# Patient Record
Sex: Female | Born: 1980 | Race: White | Hispanic: No | Marital: Married | Smoking: Never smoker
Health system: Southern US, Community
[De-identification: ages and names within clinical notes are randomized; demographics above are authoritative.]

## PROBLEM LIST (undated history)

## (undated) DIAGNOSIS — J310 Chronic rhinitis: Secondary | ICD-10-CM

## (undated) DIAGNOSIS — R51 Headache: Secondary | ICD-10-CM

## (undated) DIAGNOSIS — K589 Irritable bowel syndrome without diarrhea: Secondary | ICD-10-CM

## (undated) DIAGNOSIS — Z8619 Personal history of other infectious and parasitic diseases: Secondary | ICD-10-CM

## (undated) DIAGNOSIS — Z9889 Other specified postprocedural states: Secondary | ICD-10-CM

## (undated) DIAGNOSIS — R519 Headache, unspecified: Secondary | ICD-10-CM

## (undated) DIAGNOSIS — R112 Nausea with vomiting, unspecified: Secondary | ICD-10-CM

## (undated) HISTORY — DX: Personal history of other infectious and parasitic diseases: Z86.19

## (undated) HISTORY — PX: WISDOM TOOTH EXTRACTION: SHX21

---

## 2009-07-07 ENCOUNTER — Encounter: Admission: RE | Admit: 2009-07-07 | Discharge: 2009-07-07 | Payer: Self-pay | Admitting: Allergy

## 2011-06-25 IMAGING — CT CT MAXILLOFACIAL W/O CM
3 series · 17 of 47 positions shown, 20 images · non-contrast
Comparison: None.

CLINICAL DATA: Sinus infections over the last 3 years

CT MAXILLOFACIAL WITHOUT CONTRAST
TECHNIQUE: Multidetector CT imaging of the maxillofacial
structures was performed. Multiplanar CT image reconstructions were
also generated.

[Series 4: max soft · axial · 0.36mm/px · z∈[+102,+239]mm · 11 of 65 slices shown, 14 images]
[im 5/65  brain]
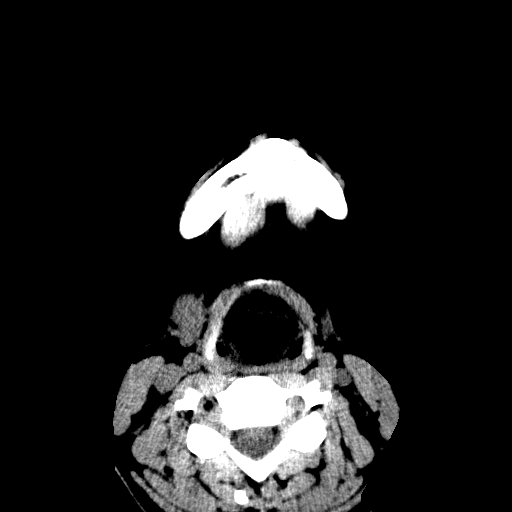
[im 5/65  bone]
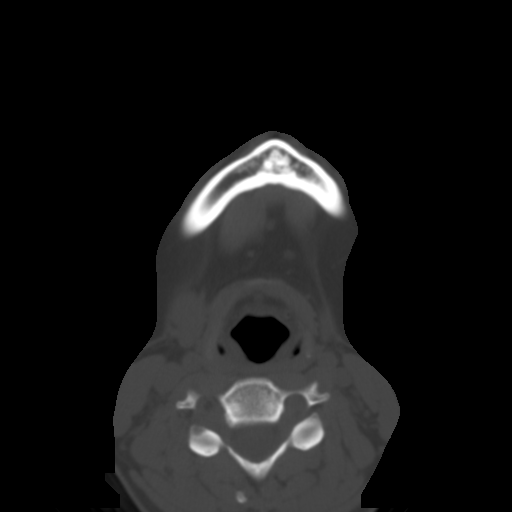
[im 9/65  bone]
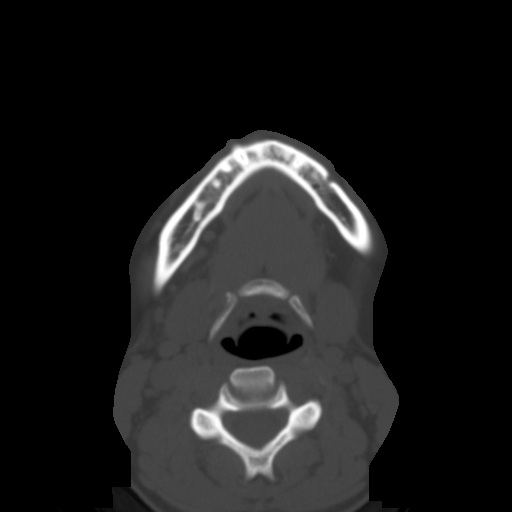
[im 16/65  bone]
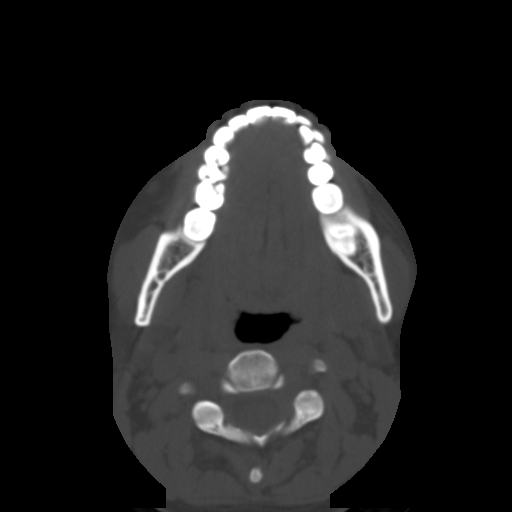
[im 20/65  bone]
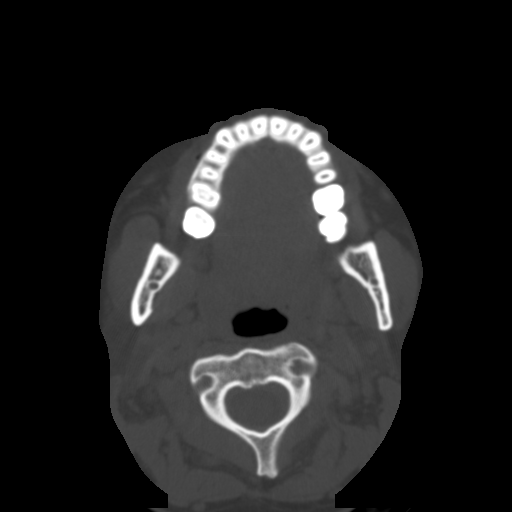
[im 27/65  brain]
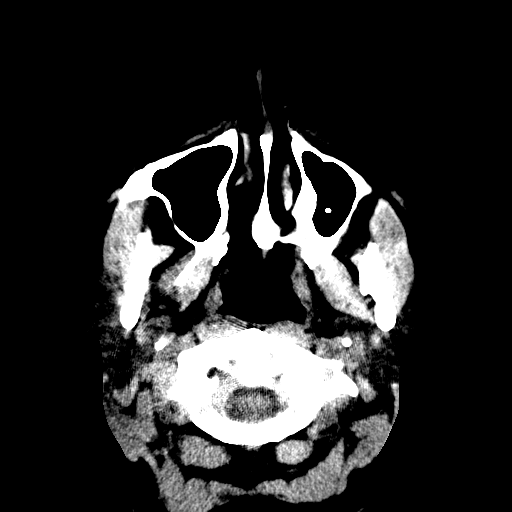
[im 27/65  bone]
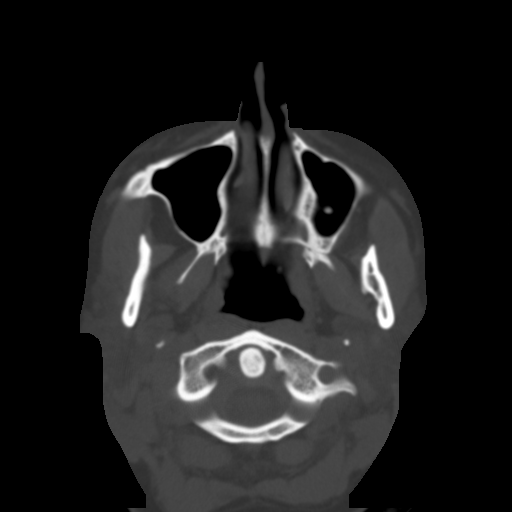
[im 34/65  bone]
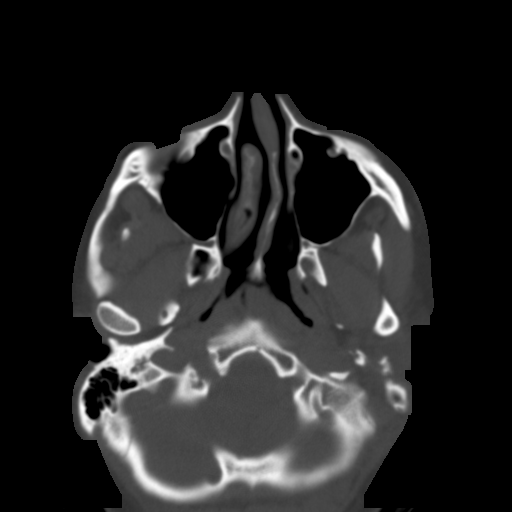
[im 38/65  bone]
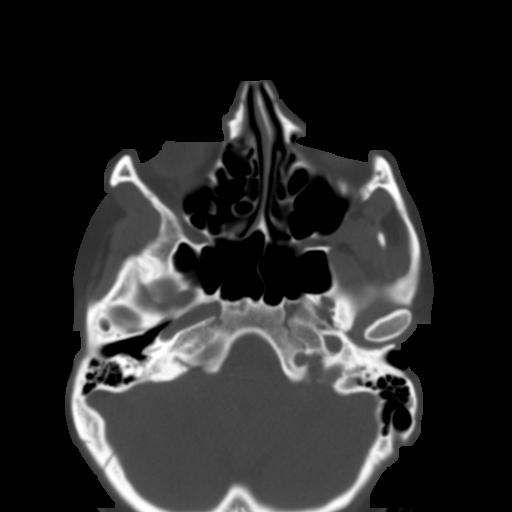
[im 45/65  bone]
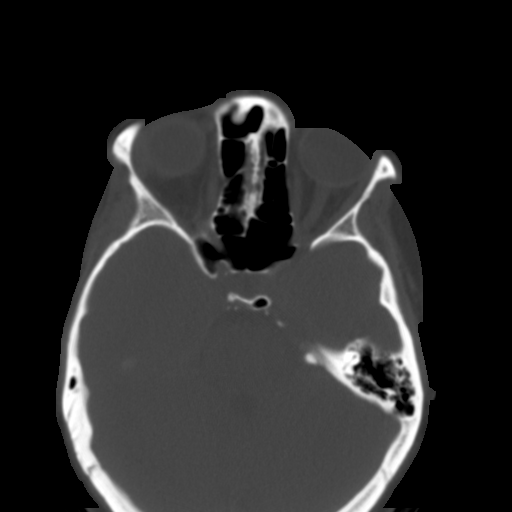
[im 49/65  brain]
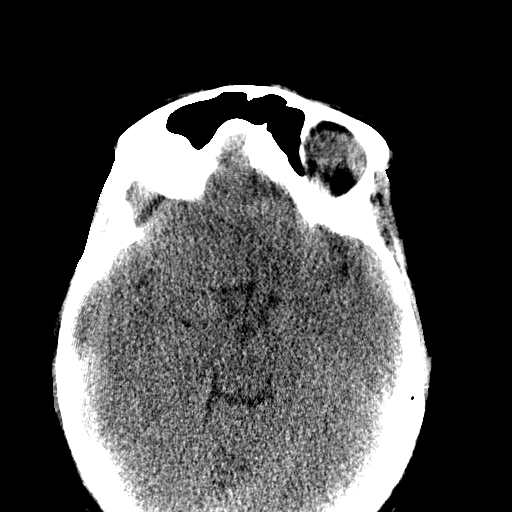
[im 49/65  bone]
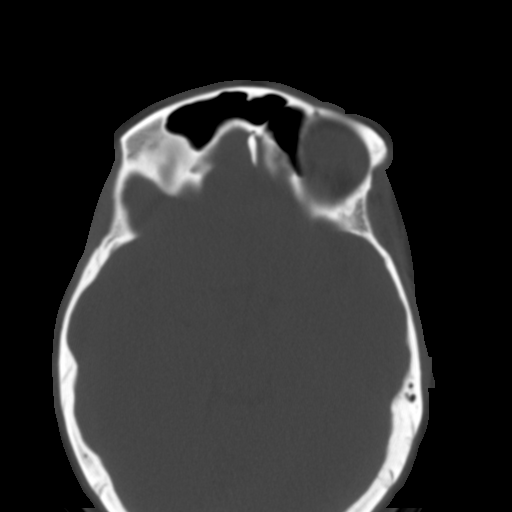
[im 56/65  bone]
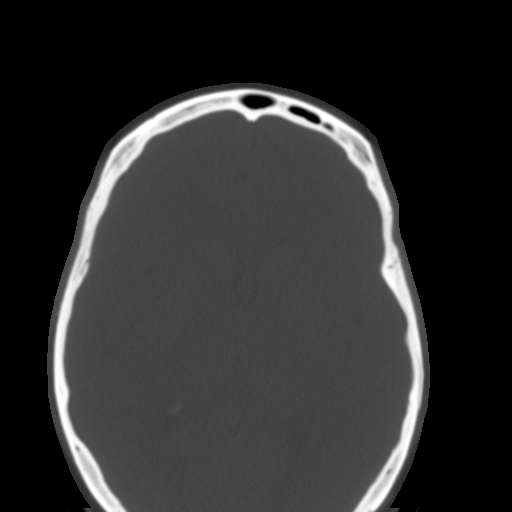
[im 60/65  bone]
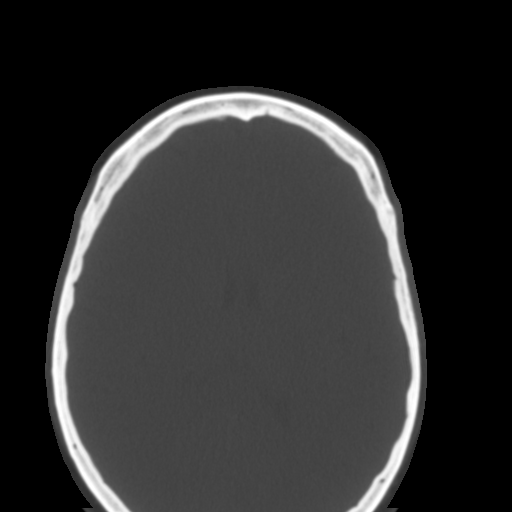

[st cor · coronal · 0.36mm/px · 3 of 41 slices shown]
[im 14/41  bone]
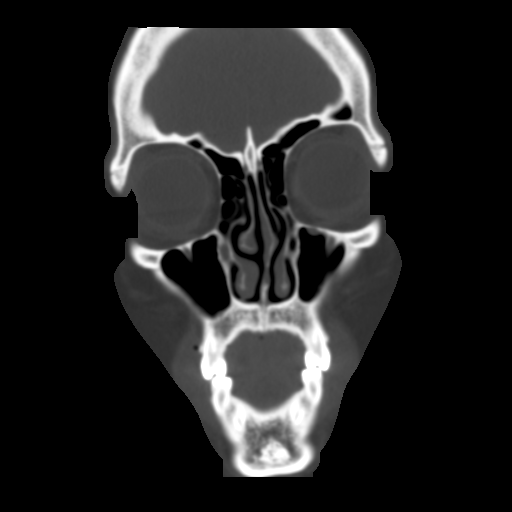
[im 18/41  bone]
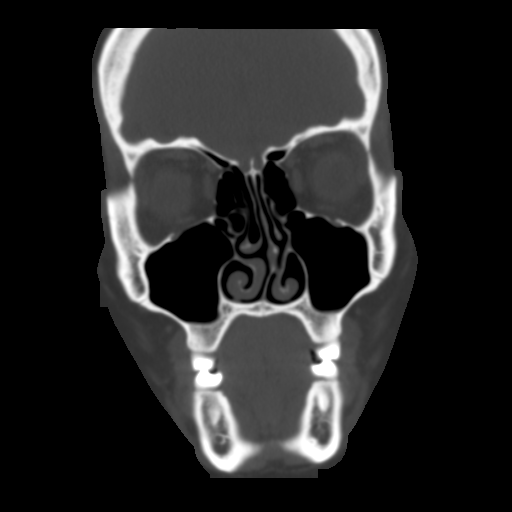
[im 23/41  bone]
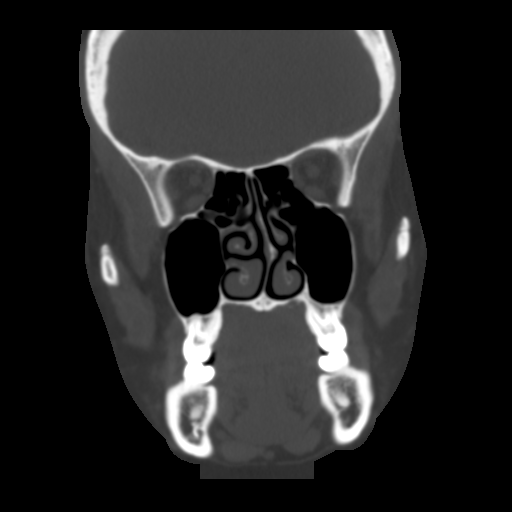

[st sag · sagittal · 0.36mm/px · 3 of 48 slices shown]
[im 16/48  bone]
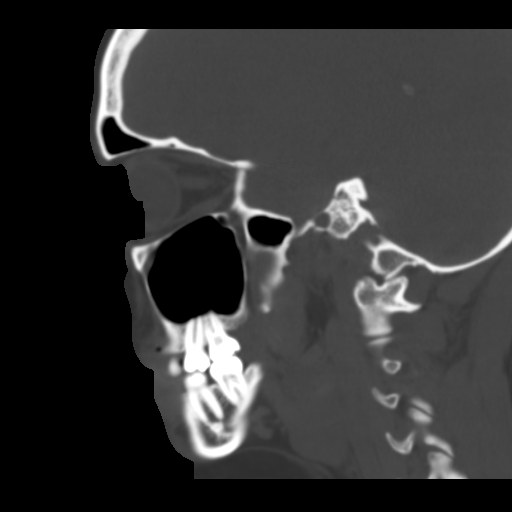
[im 24/48  bone]
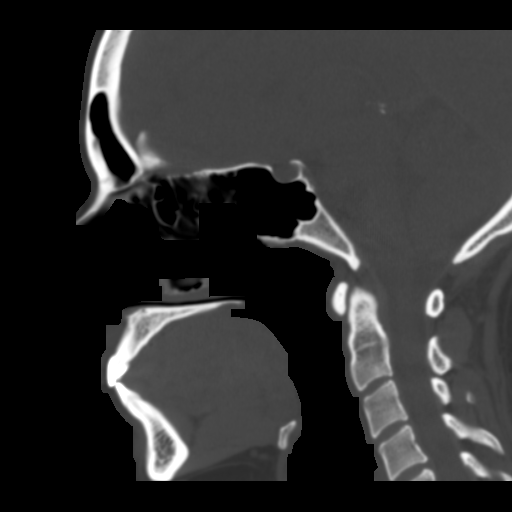
[im 32/48  bone]
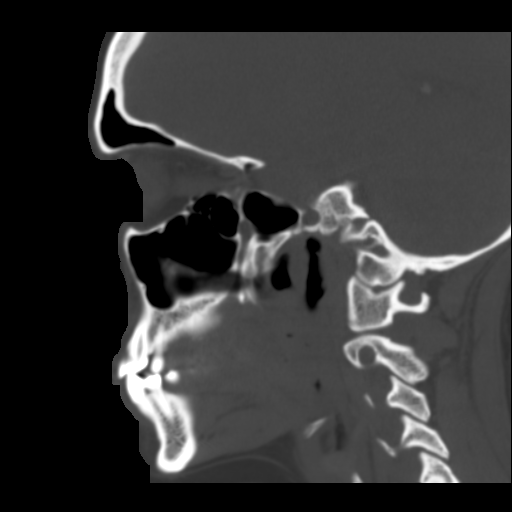

[17 of 47 positions shown; findings below may reference images not displayed]

FINDINGS: Frontal, ethmoid, maxillary, and sphenoid sinuses clear.
Minimal mucosal prominence of the left maxillary floor.  No
evidence of chronic or acute sinusitis.  Ostiomeatal complexes are
patent and symmetric.  Slight deviation of the septum to the left.
Symmetric orbits.  No soft tissue asymmetry.  Intracranial contents
are unremarkable.
IMPRESSION: Negative for sinusitis.

## 2015-05-04 ENCOUNTER — Encounter (HOSPITAL_COMMUNITY): Payer: Self-pay

## 2015-05-04 MED ORDER — CEFOTETAN DISODIUM 2 G IJ SOLR
2.0000 g | INTRAMUSCULAR | Status: AC
  Administered 2015-05-05: 2 g via INTRAVENOUS
  Filled 2015-05-04: qty 2

## 2015-05-04 NOTE — H&P (Addendum)
Kerry Hernandez is an 34 y.o. female G1P0 @ [redacted] wks gestation with missed Ab.  Pt declines medical mngt w/ misoprostol & presents for surgery    No LMP recorded.    PMx:  IBS   No past surgical history on file.  No family history on file.  Social History:  has no tobacco, alcohol, and drug history on file.  Allergies: Allergies not on file - sensitivity to prednisone  No prescriptions prior to admission  PNV  ROS  AF, VSS  Physical Exam  Gen - NAD ABd - soft, NT CV - RRR Lungs - clear PV - deferred  Blood type A+  Assessment/Plan:  Missed Ab D&E R/b/a discussed, questions answered, informed consent  Kerry Hernandez 05/04/2015, 11:39 AM

## 2015-05-05 ENCOUNTER — Encounter (HOSPITAL_COMMUNITY)
Admission: RE | Disposition: A | Discharge status: Home / Self Care | Payer: Self-pay | Source: Ambulatory Visit | Attending: Obstetrics and Gynecology

## 2015-05-05 ENCOUNTER — Ambulatory Visit (HOSPITAL_COMMUNITY): Payer: BLUE CROSS/BLUE SHIELD | Admitting: Anesthesiology

## 2015-05-05 ENCOUNTER — Encounter (HOSPITAL_COMMUNITY): Payer: Self-pay | Admitting: Anesthesiology

## 2015-05-05 ENCOUNTER — Ambulatory Visit (HOSPITAL_COMMUNITY)
Admission: RE | Admit: 2015-05-05 | Discharge: 2015-05-05 | Disposition: A | Discharge status: Home / Self Care | Payer: BLUE CROSS/BLUE SHIELD | Source: Ambulatory Visit | Attending: Obstetrics and Gynecology | Admitting: Obstetrics and Gynecology

## 2015-05-05 DIAGNOSIS — O021 Missed abortion: Secondary | ICD-10-CM | POA: Insufficient documentation

## 2015-05-05 HISTORY — DX: Irritable bowel syndrome, unspecified: K58.9

## 2015-05-05 HISTORY — DX: Headache: R51

## 2015-05-05 HISTORY — DX: Chronic rhinitis: J31.0

## 2015-05-05 HISTORY — DX: Other specified postprocedural states: Z98.890

## 2015-05-05 HISTORY — DX: Other specified postprocedural states: R11.2

## 2015-05-05 HISTORY — DX: Headache, unspecified: R51.9

## 2015-05-05 HISTORY — PX: DILATION AND EVACUATION: SHX1459

## 2015-05-05 LAB — CBC
HEMATOCRIT: 39.8 % (ref 36.0–46.0)
Hemoglobin: 13.3 g/dL (ref 12.0–15.0)
MCH: 29.1 pg (ref 26.0–34.0)
MCHC: 33.4 g/dL (ref 30.0–36.0)
MCV: 87.1 fL (ref 78.0–100.0)
Platelets: 343 10*3/uL (ref 150–400)
RBC: 4.57 MIL/uL (ref 3.87–5.11)
RDW: 12.4 % (ref 11.5–15.5)
WBC: 8.8 10*3/uL (ref 4.0–10.5)

## 2015-05-05 SURGERY — DILATION AND EVACUATION, UTERUS
Anesthesia: Monitor Anesthesia Care | Site: Vagina

## 2015-05-05 MED ORDER — ONDANSETRON HCL 4 MG/2ML IJ SOLN
INTRAMUSCULAR | Status: AC
  Filled 2015-05-05: qty 2

## 2015-05-05 MED ORDER — FENTANYL CITRATE (PF) 100 MCG/2ML IJ SOLN
INTRAMUSCULAR | Status: AC
  Filled 2015-05-05: qty 2

## 2015-05-05 MED ORDER — FENTANYL CITRATE (PF) 100 MCG/2ML IJ SOLN
INTRAMUSCULAR | Status: DC | PRN
  Administered 2015-05-05: 100 ug via INTRAVENOUS

## 2015-05-05 MED ORDER — HYDROCODONE-ACETAMINOPHEN 7.5-325 MG PO TABS: 1.0000 | ORAL_TABLET | Freq: Once | ORAL | Status: DC | PRN

## 2015-05-05 MED ORDER — PROPOFOL 500 MG/50ML IV EMUL
INTRAVENOUS | Status: DC | PRN
  Administered 2015-05-05: 150 ug/kg/min via INTRAVENOUS

## 2015-05-05 MED ORDER — CHLOROPROCAINE HCL 1 % IJ SOLN
INTRAMUSCULAR | Status: AC
  Filled 2015-05-05: qty 30

## 2015-05-05 MED ORDER — KETOROLAC TROMETHAMINE 30 MG/ML IJ SOLN
INTRAMUSCULAR | Status: DC | PRN
Start: 2015-05-05 — End: 2015-05-05
  Administered 2015-05-05: 30 mg via INTRAVENOUS

## 2015-05-05 MED ORDER — METOCLOPRAMIDE HCL 5 MG/ML IJ SOLN: 10.0000 mg | Freq: Once | INTRAMUSCULAR | Status: DC | PRN

## 2015-05-05 MED ORDER — MIDAZOLAM HCL 2 MG/2ML IJ SOLN
INTRAMUSCULAR | Status: AC
Start: 2015-05-05 — End: 2015-05-05
  Filled 2015-05-05: qty 2

## 2015-05-05 MED ORDER — FENTANYL CITRATE (PF) 100 MCG/2ML IJ SOLN
25.0000 ug | INTRAMUSCULAR | Status: DC | PRN
  Administered 2015-05-05: 25 ug via INTRAVENOUS
  Administered 2015-05-05: 50 ug via INTRAVENOUS

## 2015-05-05 MED ORDER — LIDOCAINE HCL (CARDIAC) 20 MG/ML IV SOLN
INTRAVENOUS | Status: DC | PRN
  Administered 2015-05-05: 60 mg via INTRAVENOUS

## 2015-05-05 MED ORDER — LIDOCAINE HCL (CARDIAC) 20 MG/ML IV SOLN
INTRAVENOUS | Status: AC
  Filled 2015-05-05: qty 5

## 2015-05-05 MED ORDER — PROPOFOL 10 MG/ML IV BOLUS
INTRAVENOUS | Status: AC
  Filled 2015-05-05: qty 20

## 2015-05-05 MED ORDER — MIDAZOLAM HCL 5 MG/5ML IJ SOLN
INTRAMUSCULAR | Status: DC | PRN
  Administered 2015-05-05: 2 mg via INTRAVENOUS

## 2015-05-05 MED ORDER — LACTATED RINGERS IV SOLN
INTRAVENOUS | Status: DC
  Administered 2015-05-05: 13:00:00 via INTRAVENOUS

## 2015-05-05 MED ORDER — HYDROCODONE-IBUPROFEN 7.5-200 MG PO TABS: 1.0000 | ORAL_TABLET | Freq: Three times a day (TID) | ORAL | Status: DC | PRN

## 2015-05-05 MED ORDER — DEXAMETHASONE SODIUM PHOSPHATE 4 MG/ML IJ SOLN
INTRAMUSCULAR | Status: AC
  Filled 2015-05-05: qty 1

## 2015-05-05 MED ORDER — KETOROLAC TROMETHAMINE 30 MG/ML IJ SOLN
INTRAMUSCULAR | Status: AC
  Filled 2015-05-05: qty 1

## 2015-05-05 MED ORDER — SCOPOLAMINE 1 MG/3DAYS TD PT72
1.0000 | MEDICATED_PATCH | Freq: Once | TRANSDERMAL | Status: DC
  Administered 2015-05-05: 1.5 mg via TRANSDERMAL

## 2015-05-05 MED ORDER — SCOPOLAMINE 1 MG/3DAYS TD PT72
MEDICATED_PATCH | TRANSDERMAL | Status: AC
  Administered 2015-05-05: 1.5 mg via TRANSDERMAL
  Filled 2015-05-05: qty 1

## 2015-05-05 MED ORDER — ONDANSETRON HCL 4 MG/2ML IJ SOLN
INTRAMUSCULAR | Status: DC | PRN
  Administered 2015-05-05: 4 mg via INTRAVENOUS

## 2015-05-05 MED ORDER — DEXAMETHASONE SODIUM PHOSPHATE 10 MG/ML IJ SOLN
INTRAMUSCULAR | Status: AC
  Filled 2015-05-05: qty 1

## 2015-05-05 MED ORDER — CHLOROPROCAINE HCL 1 % IJ SOLN
INTRAMUSCULAR | Status: DC | PRN
  Administered 2015-05-05: 10 mL

## 2015-05-05 MED ORDER — MEPERIDINE HCL 25 MG/ML IJ SOLN: 6.2500 mg | INTRAMUSCULAR | Status: DC | PRN

## 2015-05-05 SURGICAL SUPPLY — 19 items
CATH ROBINSON RED A/P 16FR (CATHETERS) ×3 IMPLANT
CLOTH BEACON ORANGE TIMEOUT ST (SAFETY) ×3 IMPLANT
DECANTER SPIKE VIAL GLASS SM (MISCELLANEOUS) ×3 IMPLANT
GLOVE BIO SURGEON STRL SZ 6.5 (GLOVE) ×2 IMPLANT
GLOVE BIO SURGEONS STRL SZ 6.5 (GLOVE) ×1
GLOVE BIOGEL PI IND STRL 7.0 (GLOVE) ×2 IMPLANT
GLOVE BIOGEL PI INDICATOR 7.0 (GLOVE) ×4
GOWN STRL REUS W/TWL LRG LVL3 (GOWN DISPOSABLE) ×6 IMPLANT
KIT BERKELEY 1ST TRIMESTER 3/8 (MISCELLANEOUS) ×3 IMPLANT
NS IRRIG 1000ML POUR BTL (IV SOLUTION) ×3 IMPLANT
PACK VAGINAL MINOR WOMEN LF (CUSTOM PROCEDURE TRAY) ×3 IMPLANT
PAD OB MATERNITY 4.3X12.25 (PERSONAL CARE ITEMS) ×3 IMPLANT
PAD PREP 24X48 CUFFED NSTRL (MISCELLANEOUS) ×3 IMPLANT
SET BERKELEY SUCTION TUBING (SUCTIONS) ×3 IMPLANT
TOWEL OR 17X24 6PK STRL BLUE (TOWEL DISPOSABLE) ×6 IMPLANT
VACURETTE 10 RIGID CVD (CANNULA) IMPLANT
VACURETTE 7MM CVD STRL WRAP (CANNULA) ×3 IMPLANT
VACURETTE 8 RIGID CVD (CANNULA) IMPLANT
VACURETTE 9 RIGID CVD (CANNULA) IMPLANT

## 2015-05-05 NOTE — Transfer of Care (Signed)
Immediate Anesthesia Transfer of Care Note  Patient: Kerry LoosenJennifer Hernandez  Procedure(s) Performed: Procedure(s): DILATATION AND EVACUATION (N/A)  Patient Location: PACU  Anesthesia Type:MAC  Level of Consciousness: awake, alert  and oriented  Airway & Oxygen Therapy: Patient Spontanous Breathing and Patient connected to nasal cannula oxygen  Post-op Assessment: Report given to RN and Post -op Vital signs reviewed and stable  Post vital signs: Reviewed and stable  Last Vitals:  Filed Vitals:   05/05/15 1243  BP: 124/76  Pulse: 86  Temp: 36.9 C  Resp: 18    Complications: No apparent anesthesia complications

## 2015-05-05 NOTE — Discharge Instructions (Signed)
[facility] office 2-3 weeks for postop appointment.  Call the office [phone] for an appointment. ° °Personal Hygiene: °Use pads not tampons x 1week °You may shower, no tub baths or pools for 2-3 weeks °Wipe from front to back when using restroom ° °Activity: °Do not drive or operate any equipment for 24 hrs.   °Do not rest in bed all day °Walking is encouraged °Walk up and down stairs slowly °You may return to your normal activity in 1-2 days ° °Sexual Activity:  No intercourse for 2 weeks after the procedure. ° °Diet: Eat a light meal as desired this evening.  You may resume your usual diet tomorrow. ° °Return to work:  You may resume your work activities after 1-2 days ° °What to expect:  Expect to have vaginal bleeding/discharge for 2-3 days and spotting for 10-14 days.  It is not unusual to have soreness for 1-2 weeks.  You may have a slight burning sensation when you urinate for the first few days.  You may start your menses in 2-6 weeks.  Mild cramps may continue for a couple of days.   ° °Call your doctor:   °Excessive bleeding, saturating a pad every hour °Inability to urinate 6 hours after discharge °Pain not relieved with pain medications °Fever of 100.4 or greater ° ° °Post Anesthesia Home Care Instructions ° °Activity: °Get plenty of rest for the remainder of the day. A responsible adult should stay with you for 24 hours following the procedure.  °For the next 24 hours, DO NOT: °-Drive a car °-Operate machinery °-Drink alcoholic beverages °-Take any medication unless instructed by your physician °-Make any legal decisions or sign important papers. ° °Meals: °Start with liquid foods such as gelatin or soup. Progress to regular foods as tolerated. Avoid greasy, spicy, heavy foods. If nausea and/or vomiting occur, drink only clear liquids until the nausea and/or vomiting subsides. Call your physician if vomiting continues. ° °Special Instructions/Symptoms: °Your throat may feel dry or sore from the anesthesia or  the breathing tube placed in your throat during surgery. If this causes discomfort, gargle with warm salt water. The discomfort should disappear within 24 hours. ° °If you had a scopolamine patch placed behind your ear for the management of post- operative nausea and/or vomiting: ° °1. The medication in the patch is effective for 72 hours, after which it should be removed.  Wrap patch in a tissue and discard in the trash. Wash hands thoroughly with soap and water. °2. You may remove the patch earlier than 72 hours if you experience unpleasant side effects which may include dry mouth, dizziness or visual disturbances. °3. Avoid touching the patch. Wash your hands with soap and water after contact with the patch. °  ° °

## 2015-05-05 NOTE — Anesthesia Preprocedure Evaluation (Signed)
Anesthesia Evaluation  Patient identified by MRN, date of birth, ID band Patient awake    Reviewed: Allergy & Precautions, NPO status , Patient's Chart, lab work & pertinent test results  History of Anesthesia Complications (+) PONV and history of anesthetic complications  Airway Mallampati: I       Dental no notable dental hx. (+) Teeth Intact   Pulmonary neg pulmonary ROS,    Pulmonary exam normal breath sounds clear to auscultation       Cardiovascular negative cardio ROS Normal cardiovascular exam Rhythm:Regular Rate:Normal     Neuro/Psych  Headaches, negative psych ROS   GI/Hepatic Neg liver ROS, IBS   Endo/Other  negative endocrine ROS  Renal/GU negative Renal ROS  negative genitourinary   Musculoskeletal negative musculoskeletal ROS (+)   Abdominal   Peds  Hematology negative hematology ROS (+)   Anesthesia Other Findings   Reproductive/Obstetrics (+) Pregnancy Missed Ab 9 weeks                             Anesthesia Physical Anesthesia Plan  ASA: II  Anesthesia Plan: MAC   Post-op Pain Management:    Induction: Intravenous  Airway Management Planned: Natural Airway and Nasal Cannula  Additional Equipment:   Intra-op Plan:   Post-operative Plan:   Informed Consent: I have reviewed the patients History and Physical, chart, labs and discussed the procedure including the risks, benefits and alternatives for the proposed anesthesia with the patient or authorized representative who has indicated his/her understanding and acceptance.     Plan Discussed with: CRNA, Anesthesiologist and Surgeon  Anesthesia Plan Comments:         Anesthesia Quick Evaluation

## 2015-05-05 NOTE — Anesthesia Postprocedure Evaluation (Signed)
Anesthesia Post Note  Patient: Kerry LoosenJennifer Hernandez  Procedure(s) Performed: Procedure(s) (LRB): DILATATION AND EVACUATION (N/A)  Patient location during evaluation: PACU Anesthesia Type: MAC Level of consciousness: awake and alert Pain management: pain level controlled Vital Signs Assessment: post-procedure vital signs reviewed and stable Respiratory status: spontaneous breathing, nonlabored ventilation and respiratory function stable Cardiovascular status: stable and blood pressure returned to baseline Postop Assessment: no signs of nausea or vomiting Anesthetic complications: no    Last Vitals:  Filed Vitals:   05/05/15 1500 05/05/15 1515  BP: 104/66 105/60  Pulse: 62 61  Temp:    Resp: 15 12    Last Pain:  Filed Vitals:   05/05/15 1520  PainSc: 4                  Chevi Lim A.

## 2015-05-06 NOTE — Op Note (Signed)
NAMJunie Spencer:  Hernandez, Kerry Hernandez              ACCOUNT NO.:  [id number]  MEDICAL RECORD NO.:  [id number]  LOCATION:  [facility]                          FACILITY:  [facility]  PHYSICIAN:  Zelphia CairoGretchen Hooria Gasparini, MD    DATE OF BIRTH:  July 17, 1980  DATE OF PROCEDURE: DATE OF DISCHARGE:  05/05/2015                              OPERATIVE REPORT   PREOPERATIVE DIAGNOSIS:  Missed abortion.  POSTOPERATIVE DIAGNOSIS:  Missed abortion.  PROCEDURE: 1. Paracervical block. 2. Dilation and evacuation.  SURGEON:  Zelphia CairoGretchen Savi Lastinger, MD  ANESTHESIA:  General.  SPECIMEN:  Products of conception.  CONDITION:  Stable to recovery room.  PROCEDURE IN DETAIL:  The patient was taken to the operating room, where anesthesia was found to be adequate.  She was placed in the dorsal lithotomy position using Allen stirrups, prepped and draped in sterile fashion.  Bivalve speculum was placed in the vagina and 1 mL of 1% Nesacaine was injected at the 12 o'clock position of the cervix.  Single- tooth tenaculum was attached to the 12 o'clock position and the remaining 9 mL was used to perform a paracervical block.  The cervix was then serially dilated using Pratt dilators and a 7-French suction catheter was introduced and used to remove any products of conception. A gentle curetting was then performed and a uterine cry was noted throughout.  Suction curette was reinserted to remove any clots and debris.  Tenaculum was removed.  The cervix was hemostatic.  Speculum was removed.  The patient was taken to the recovery room in stable condition.     Zelphia CairoGretchen Charlotte Fidalgo, MD     GA/MEDQ  D:  05/05/2015  T:  05/05/2015  Job:  [id number]

## 2015-05-08 ENCOUNTER — Encounter (HOSPITAL_COMMUNITY): Payer: Self-pay | Admitting: Obstetrics and Gynecology

## 2015-10-27 LAB — OB RESULTS CONSOLE GC/CHLAMYDIA
Chlamydia: NEGATIVE
Gonorrhea: NEGATIVE

## 2015-10-27 LAB — OB RESULTS CONSOLE HIV ANTIBODY (ROUTINE TESTING): HIV: NONREACTIVE

## 2015-10-27 LAB — OB RESULTS CONSOLE HEPATITIS B SURFACE ANTIGEN: HEP B S AG: NEGATIVE

## 2015-10-27 LAB — OB RESULTS CONSOLE RUBELLA ANTIBODY, IGM: Rubella: IMMUNE

## 2015-10-27 LAB — OB RESULTS CONSOLE ABO/RH: RH TYPE: POSITIVE

## 2015-10-27 LAB — OB RESULTS CONSOLE ANTIBODY SCREEN: ANTIBODY SCREEN: NEGATIVE

## 2015-10-27 LAB — OB RESULTS CONSOLE RPR: RPR: NONREACTIVE

## 2015-11-27 ENCOUNTER — Inpatient Hospital Stay (HOSPITAL_COMMUNITY)
Admission: AD | Admit: 2015-11-27 | Payer: BLUE CROSS/BLUE SHIELD | Source: Ambulatory Visit | Admitting: Obstetrics and Gynecology

## 2016-04-27 LAB — OB RESULTS CONSOLE GBS: GBS: NEGATIVE

## 2016-05-27 NOTE — L&D Delivery Note (Signed)
Delivery Note At 1:32 PM a viable female was delivered via Vaginal, Spontaneous Delivery   APGAR: 9, 9; weight  pending.   Placenta status:spontaneously with 3 vessel cord , .  Cord:  with the following complications: short.  Cord pH: not obtained  Anesthesia:  epidural Episiotomy:  none Lacerations: 2nd degree Suture Repair: 3.0 chromic Est. Blood Loss (mL):  300  Mom to postpartum.  Baby to Couplet care / Skin to Skin.  Chapin Arduini L 05/31/2016, 1:46 PM

## 2016-05-30 ENCOUNTER — Encounter (HOSPITAL_COMMUNITY): Payer: Self-pay | Admitting: *Deleted

## 2016-05-30 ENCOUNTER — Telehealth (HOSPITAL_COMMUNITY): Payer: Self-pay | Admitting: *Deleted

## 2016-05-30 ENCOUNTER — Inpatient Hospital Stay (HOSPITAL_COMMUNITY): Payer: BLUE CROSS/BLUE SHIELD

## 2016-05-30 NOTE — Telephone Encounter (Signed)
Preadmission screen  

## 2016-05-31 ENCOUNTER — Inpatient Hospital Stay (HOSPITAL_COMMUNITY)
Admission: RE | Admit: 2016-05-31 | Discharge: 2016-06-02 | DRG: 775 | Disposition: A | Discharge status: Home / Self Care | Payer: BLUE CROSS/BLUE SHIELD | Source: Ambulatory Visit | Attending: Obstetrics and Gynecology | Admitting: Obstetrics and Gynecology

## 2016-05-31 ENCOUNTER — Inpatient Hospital Stay (HOSPITAL_COMMUNITY): Payer: BLUE CROSS/BLUE SHIELD | Admitting: Anesthesiology

## 2016-05-31 ENCOUNTER — Encounter (HOSPITAL_COMMUNITY): Payer: Self-pay

## 2016-05-31 DIAGNOSIS — Z3490 Encounter for supervision of normal pregnancy, unspecified, unspecified trimester: Secondary | ICD-10-CM

## 2016-05-31 DIAGNOSIS — Z833 Family history of diabetes mellitus: Secondary | ICD-10-CM | POA: Diagnosis not present

## 2016-05-31 DIAGNOSIS — Z823 Family history of stroke: Secondary | ICD-10-CM

## 2016-05-31 DIAGNOSIS — Z8249 Family history of ischemic heart disease and other diseases of the circulatory system: Secondary | ICD-10-CM | POA: Diagnosis not present

## 2016-05-31 DIAGNOSIS — Z3A39 39 weeks gestation of pregnancy: Secondary | ICD-10-CM

## 2016-05-31 DIAGNOSIS — O134 Gestational [pregnancy-induced] hypertension without significant proteinuria, complicating childbirth: Secondary | ICD-10-CM | POA: Diagnosis present

## 2016-05-31 LAB — CBC
HEMATOCRIT: 36.3 % (ref 36.0–46.0)
HEMOGLOBIN: 12.3 g/dL (ref 12.0–15.0)
MCH: 27.1 pg (ref 26.0–34.0)
MCHC: 33.9 g/dL (ref 30.0–36.0)
MCV: 80 fL (ref 78.0–100.0)
Platelets: 308 10*3/uL (ref 150–400)
RBC: 4.54 MIL/uL (ref 3.87–5.11)
RDW: 14.3 % (ref 11.5–15.5)
WBC: 10.9 10*3/uL — AB (ref 4.0–10.5)

## 2016-05-31 LAB — TYPE AND SCREEN
ABO/RH(D): A POS
ANTIBODY SCREEN: NEGATIVE

## 2016-05-31 LAB — ABO/RH: ABO/RH(D): A POS

## 2016-05-31 LAB — RPR: RPR Ser Ql: NONREACTIVE

## 2016-05-31 MED ORDER — BISACODYL 10 MG RE SUPP: 10.0000 mg | Freq: Every day | RECTAL | Status: DC | PRN

## 2016-05-31 MED ORDER — PRENATAL MULTIVITAMIN CH
1.0000 | ORAL_TABLET | Freq: Every day | ORAL | Status: DC
  Administered 2016-06-01 – 2016-06-02 (×2): 1 via ORAL
  Filled 2016-05-31 (×2): qty 1

## 2016-05-31 MED ORDER — TERBUTALINE SULFATE 1 MG/ML IJ SOLN
0.2500 mg | Freq: Once | INTRAMUSCULAR | Status: DC | PRN
  Filled 2016-05-31: qty 1

## 2016-05-31 MED ORDER — DIBUCAINE 1 % RE OINT: 1.0000 "application " | TOPICAL_OINTMENT | RECTAL | Status: DC | PRN

## 2016-05-31 MED ORDER — ACETAMINOPHEN 325 MG PO TABS: 650.0000 mg | ORAL_TABLET | ORAL | Status: DC | PRN

## 2016-05-31 MED ORDER — ZOLPIDEM TARTRATE 5 MG PO TABS: 5.0000 mg | ORAL_TABLET | Freq: Every evening | ORAL | Status: DC | PRN

## 2016-05-31 MED ORDER — ONDANSETRON HCL 4 MG PO TABS: 4.0000 mg | ORAL_TABLET | ORAL | Status: DC | PRN

## 2016-05-31 MED ORDER — COCONUT OIL OIL
1.0000 "application " | TOPICAL_OIL | Status: DC | PRN
  Administered 2016-05-31: 1 via TOPICAL
  Filled 2016-05-31: qty 120

## 2016-05-31 MED ORDER — PHENYLEPHRINE 40 MCG/ML (10ML) SYRINGE FOR IV PUSH (FOR BLOOD PRESSURE SUPPORT)
80.0000 ug | PREFILLED_SYRINGE | INTRAVENOUS | Status: DC | PRN
  Filled 2016-05-31: qty 5
  Filled 2016-05-31: qty 10

## 2016-05-31 MED ORDER — OXYTOCIN 40 UNITS IN LACTATED RINGERS INFUSION - SIMPLE MED
1.0000 m[IU]/min | INTRAVENOUS | Status: DC
  Administered 2016-05-31: 2 m[IU]/min via INTRAVENOUS
  Filled 2016-05-31: qty 1000

## 2016-05-31 MED ORDER — DIPHENHYDRAMINE HCL 50 MG/ML IJ SOLN: 12.5000 mg | INTRAMUSCULAR | Status: DC | PRN

## 2016-05-31 MED ORDER — FENTANYL 2.5 MCG/ML BUPIVACAINE 1/10 % EPIDURAL INFUSION (WH - ANES)
14.0000 mL/h | INTRAMUSCULAR | Status: DC | PRN
  Administered 2016-05-31: 14 mL/h via EPIDURAL
  Filled 2016-05-31: qty 100

## 2016-05-31 MED ORDER — WITCH HAZEL-GLYCERIN EX PADS: 1.0000 "application " | MEDICATED_PAD | CUTANEOUS | Status: DC | PRN

## 2016-05-31 MED ORDER — PHENYLEPHRINE 40 MCG/ML (10ML) SYRINGE FOR IV PUSH (FOR BLOOD PRESSURE SUPPORT)
80.0000 ug | PREFILLED_SYRINGE | INTRAVENOUS | Status: DC | PRN
  Filled 2016-05-31: qty 5

## 2016-05-31 MED ORDER — LACTATED RINGERS IV SOLN
INTRAVENOUS | Status: DC
  Administered 2016-05-31: 02:00:00 via INTRAVENOUS

## 2016-05-31 MED ORDER — ACETAMINOPHEN 325 MG PO TABS
650.0000 mg | ORAL_TABLET | ORAL | Status: DC | PRN
  Administered 2016-05-31: 650 mg via ORAL
  Filled 2016-05-31: qty 2

## 2016-05-31 MED ORDER — OXYTOCIN BOLUS FROM INFUSION
500.0000 mL | Freq: Once | INTRAVENOUS | Status: DC
Start: 2016-05-31 — End: 2016-05-31

## 2016-05-31 MED ORDER — IBUPROFEN 600 MG PO TABS
600.0000 mg | ORAL_TABLET | Freq: Four times a day (QID) | ORAL | Status: DC
  Administered 2016-05-31 – 2016-06-02 (×8): 600 mg via ORAL
  Filled 2016-05-31 (×8): qty 1

## 2016-05-31 MED ORDER — SOD CITRATE-CITRIC ACID 500-334 MG/5ML PO SOLN: 30.0000 mL | ORAL | Status: DC | PRN

## 2016-05-31 MED ORDER — DIPHENHYDRAMINE HCL 25 MG PO CAPS: 25.0000 mg | ORAL_CAPSULE | Freq: Four times a day (QID) | ORAL | Status: DC | PRN

## 2016-05-31 MED ORDER — SENNOSIDES-DOCUSATE SODIUM 8.6-50 MG PO TABS
2.0000 | ORAL_TABLET | ORAL | Status: DC
  Administered 2016-06-01 (×2): 2 via ORAL
  Filled 2016-05-31 (×2): qty 2

## 2016-05-31 MED ORDER — MISOPROSTOL 25 MCG QUARTER TABLET
25.0000 ug | ORAL_TABLET | ORAL | Status: DC | PRN
  Administered 2016-05-31: 25 ug via VAGINAL
  Filled 2016-05-31: qty 1
  Filled 2016-05-31 (×2): qty 0.25

## 2016-05-31 MED ORDER — LIDOCAINE HCL (PF) 1 % IJ SOLN
30.0000 mL | INTRAMUSCULAR | Status: DC | PRN
  Filled 2016-05-31: qty 30

## 2016-05-31 MED ORDER — EPHEDRINE 5 MG/ML INJ
10.0000 mg | INTRAVENOUS | Status: DC | PRN
  Filled 2016-05-31: qty 4

## 2016-05-31 MED ORDER — FLEET ENEMA 7-19 GM/118ML RE ENEM: 1.0000 | ENEMA | Freq: Every day | RECTAL | Status: DC | PRN

## 2016-05-31 MED ORDER — MEASLES, MUMPS & RUBELLA VAC ~~LOC~~ INJ: 0.5000 mL | INJECTION | Freq: Once | SUBCUTANEOUS | Status: DC

## 2016-05-31 MED ORDER — ONDANSETRON HCL 4 MG/2ML IJ SOLN
4.0000 mg | Freq: Four times a day (QID) | INTRAMUSCULAR | Status: DC | PRN
  Administered 2016-05-31: 4 mg via INTRAVENOUS
  Filled 2016-05-31: qty 2

## 2016-05-31 MED ORDER — SIMETHICONE 80 MG PO CHEW: 80.0000 mg | CHEWABLE_TABLET | ORAL | Status: DC | PRN

## 2016-05-31 MED ORDER — OXYCODONE-ACETAMINOPHEN 5-325 MG PO TABS: 2.0000 | ORAL_TABLET | ORAL | Status: DC | PRN

## 2016-05-31 MED ORDER — OXYCODONE-ACETAMINOPHEN 5-325 MG PO TABS: 1.0000 | ORAL_TABLET | ORAL | Status: DC | PRN

## 2016-05-31 MED ORDER — BENZOCAINE-MENTHOL 20-0.5 % EX AERO
1.0000 "application " | INHALATION_SPRAY | CUTANEOUS | Status: DC | PRN
  Administered 2016-05-31: 1 via TOPICAL
  Filled 2016-05-31: qty 56

## 2016-05-31 MED ORDER — TETANUS-DIPHTH-ACELL PERTUSSIS 5-2.5-18.5 LF-MCG/0.5 IM SUSP: 0.5000 mL | Freq: Once | INTRAMUSCULAR | Status: DC

## 2016-05-31 MED ORDER — BUTORPHANOL TARTRATE 1 MG/ML IJ SOLN: 1.0000 mg | INTRAMUSCULAR | Status: DC | PRN

## 2016-05-31 MED ORDER — LACTATED RINGERS IV SOLN: 500.0000 mL | Freq: Once | INTRAVENOUS | Status: DC

## 2016-05-31 MED ORDER — ONDANSETRON HCL 4 MG/2ML IJ SOLN: 4.0000 mg | INTRAMUSCULAR | Status: DC | PRN

## 2016-05-31 MED ORDER — LACTATED RINGERS IV SOLN
500.0000 mL | INTRAVENOUS | Status: DC | PRN
Start: 2016-05-31 — End: 2016-05-31

## 2016-05-31 MED ORDER — MEDROXYPROGESTERONE ACETATE 150 MG/ML IM SUSP: 150.0000 mg | INTRAMUSCULAR | Status: DC | PRN

## 2016-05-31 MED ORDER — OXYTOCIN 40 UNITS IN LACTATED RINGERS INFUSION - SIMPLE MED: 2.5000 [IU]/h | INTRAVENOUS | Status: DC

## 2016-05-31 MED ORDER — LIDOCAINE HCL (PF) 1 % IJ SOLN
INTRAMUSCULAR | Status: DC | PRN
  Administered 2016-05-31: 7 mL via EPIDURAL
  Administered 2016-05-31: 6 mL via EPIDURAL

## 2016-05-31 NOTE — Anesthesia Pain Management Evaluation Note (Signed)
  [facility] Pain Management Visit Note  Patient: Kerry LoosenJennifer Husak, 36 y.o., female  "Hello I am a member of the anesthesia team at [facility]. We have an anesthesia team available at all times to provide care throughout the hospital, including epidural management and anesthesia for C-section. I don't know your plan for the delivery whether it a natural birth, water birth, IV sedation, nitrous supplementation, doula or epidural, but we want to meet your pain goals."   1.Was your pain managed to your expectations on prior hospitalizations?   No prior hospitalizations  2.What is your expectation for pain management during this hospitalization?     Epidural  3.How can we help you reach that goal? epidural  Record the patient's initial score and the patient's pain goal.   Pain: 3  Pain Goal: 7 [facility] wants you to be able to say your pain was always managed very well.  Raiven Belizaire 05/31/2016

## 2016-05-31 NOTE — Anesthesia Preprocedure Evaluation (Signed)
Anesthesia Evaluation  Patient identified by MRN, date of birth, ID band Patient awake    Reviewed: Allergy & Precautions, H&P , NPO status , Patient's Chart, lab work & pertinent test results  Airway Mallampati: I  TM Distance: >3 FB Neck ROM: full    Dental no notable dental hx.    Pulmonary neg pulmonary ROS,    Pulmonary exam normal        Cardiovascular negative cardio ROS Normal cardiovascular exam     Neuro/Psych negative psych ROS   GI/Hepatic negative GI ROS, Neg liver ROS,   Endo/Other  negative endocrine ROS  Renal/GU negative Renal ROS     Musculoskeletal negative musculoskeletal ROS (+)   Abdominal   Peds  Hematology negative hematology ROS (+)   Anesthesia Other Findings   Reproductive/Obstetrics (+) Pregnancy                             Anesthesia Physical Anesthesia Plan  ASA: II  Anesthesia Plan: Epidural   Post-op Pain Management:    Induction:   Airway Management Planned:   Additional Equipment:   Intra-op Plan:   Post-operative Plan:   Informed Consent: I have reviewed the patients History and Physical, chart, labs and discussed the procedure including the risks, benefits and alternatives for the proposed anesthesia with the patient or authorized representative who has indicated his/her understanding and acceptance.     Plan Discussed with:   Anesthesia Plan Comments:         Anesthesia Quick Evaluation

## 2016-05-31 NOTE — Lactation Note (Signed)
This note was copied from a baby's chart. Lactation Consultation Note  Patient Name: Kerry Cyril LoosenJennifer Zeck ONGEX'BToday's Date: 05/31/2016 Reason for consult: Initial [facility]. Baby fussy at breast but once latched demonstrated some good suckling bursts. Baby has high palate, breast compression helped with latch. Basic teaching reviewed, encouraged Mom to BF with feeding ques. Lactation brochure left for review, advised of OP services and support group. Encouraged to call for assist as needed.   Maternal Data Has patient been taught Hand Expression?: Yes Does the patient have breastfeeding experience prior to this delivery?: No  Feeding Feeding Type: Breast Fed Length of feed: 20 min (off/on)  LATCH Score/Interventions Latch: Repeated attempts needed to sustain latch, nipple held in mouth throughout feeding, stimulation needed to elicit sucking reflex.  Audible Swallowing: A few with stimulation  Type of Nipple: Everted at rest and after stimulation  Comfort (Breast/Nipple): Soft / non-tender     Hold (Positioning): Assistance needed to correctly position infant at breast and maintain latch. Intervention(s): Breastfeeding basics reviewed;Support Pillows;Position options;Skin to skin  LATCH Score: 7  Lactation Tools Discussed/Used WIC Program: No   Consult Status Consult Status: Follow-up Date: 06/01/16 Follow-up type: In-patient    Alfred LevinsGranger, Philis Doke Ann 05/31/2016, 3:14 PM

## 2016-05-31 NOTE — Anesthesia Procedure Notes (Signed)
Epidural Patient location during procedure: OB Start time: 05/31/2016 9:58 AM End time: 05/31/2016 10:01 AM  Staffing Anesthesiologist: Leilani AbleHATCHETT, [place] Performed: anesthesiologist   Preanesthetic Checklist Completed: patient identified, surgical consent, pre-op evaluation, timeout performed, IV checked, risks and benefits discussed and monitors and equipment checked  Epidural Patient position: sitting Prep: site prepped and draped and DuraPrep Patient monitoring: continuous pulse ox and blood pressure Approach: midline Location: L3-L4 Injection technique: LOR air  Needle:  Needle type: Tuohy  Needle gauge: 17 G Needle length: 9 cm and 9 Needle insertion depth: 5 cm cm Catheter type: closed end flexible Catheter size: 19 Gauge Catheter at skin depth: 10 cm Test dose: negative and Other  Assessment Sensory level: T9 Events: blood not aspirated, injection not painful, no injection resistance, negative IV test and no paresthesia  Additional Notes Reason for block:procedure for pain

## 2016-05-31 NOTE — H&P (Signed)
Kerry LoosenJennifer Hernandez is a 36 y.o.G 2 P 0 at 39 w 1 day presents for induction secondary to gestational hypertension. Admitted last night and received 1 cytotec. OB History    Gravida Para Term Preterm AB Living   2       1     SAB TAB Ectopic Multiple Live Births   1             Past Medical History:  Diagnosis Date  . Headache    Migraines  . Hx of varicella   . IBS (irritable bowel syndrome)   . PONV (postoperative nausea and vomiting)   . Rhinitis, non-allergic    Past Surgical History:  Procedure Laterality Date  . DILATION AND EVACUATION N/A 05/05/2015   Procedure: DILATATION AND EVACUATION;  Surgeon: Zelphia CairoGretchen Adkins, MD;  Location: [facility];  Service: Gynecology;  Laterality: N/A;  . WISDOM TOOTH EXTRACTION     Family History: family history includes COPD in her maternal grandmother; Diabetes in her maternal grandmother and paternal grandfather; Hypertension in her father; Stroke in her maternal grandfather and maternal grandmother. Social History:  reports that she has never smoked. She does not have any smokeless tobacco history on file. She reports that she does not drink alcohol or use drugs.     Maternal Diabetes: No Genetic Screening: Normal Maternal Ultrasounds/Referrals: Normal Fetal Ultrasounds or other Referrals:  None Maternal Substance Abuse:  No Significant Maternal Medications:  None Significant Maternal Lab Results:  None Other Comments:  None  ROS History Dilation: 2.5 Effacement (%): 90 Station: -2 Exam by:: Hymen Arnett Blood pressure 128/76, pulse 77, temperature 97.3 F (36.3 C), resp. rate 16, height 5\' 9"  (1.753 m), weight 218 lb (98.9 kg), last menstrual period 08/31/2015. Maternal Exam:  Abdomen: Fetal presentation: vertex     Fetal Exam Fetal State Assessment: Category I - tracings are normal.     Physical Exam  Nursing note and vitals reviewed. Constitutional: She appears well-developed and well-nourished.  HENT:  Head: Normocephalic.   Eyes: Pupils are equal, round, and reactive to light.  Neck: Normal range of motion.  Cardiovascular: Normal rate and regular rhythm.     Prenatal labs: ABO, Rh: --/--/A POS, A POS (01/05 0152) Antibody: NEG (01/05 0152) Rubella: Immune (06/02 0000) RPR: Nonreactive (06/02 0000)  HBsAg: Negative (06/02 0000)  HIV: Non-reactive (06/02 0000)  GBS: Negative (12/02 0000)   Assessment/Plan: IUP at 39 w 1 day Gestational hypertension Pain medications discussed with patient  Birth plan reviewed Pitocin prn - risks discussed with patient   Janae Bonser L 05/31/2016, 8:00 AM

## 2016-06-01 LAB — CBC
HCT: 29.9 % — ABNORMAL LOW (ref 36.0–46.0)
Hemoglobin: 10 g/dL — ABNORMAL LOW (ref 12.0–15.0)
MCH: 27.2 pg (ref 26.0–34.0)
MCHC: 33.4 g/dL (ref 30.0–36.0)
MCV: 81.3 fL (ref 78.0–100.0)
PLATELETS: 228 10*3/uL (ref 150–400)
RBC: 3.68 MIL/uL — ABNORMAL LOW (ref 3.87–5.11)
RDW: 14.4 % (ref 11.5–15.5)
WBC: 14 10*3/uL — ABNORMAL HIGH (ref 4.0–10.5)

## 2016-06-01 NOTE — Progress Notes (Signed)
Patient doing well. No complaints.  BP 136/82 (BP Location: Right Arm)   Pulse 64   Temp 97.8 F (36.6 C) (Oral)   Resp 18   Ht 5\' 9"  (1.753 m)   Wt 218 lb (98.9 kg)   LMP 08/31/2015   SpO2 99%   Breastfeeding? Unknown   BMI 32.19 kg/m  Results for orders placed or performed during the hospital encounter of 05/31/16 (from the past 24 hour(s))  CBC     Status: Abnormal   Collection Time: 06/01/16  5:18 AM  Result Value Ref Range   WBC 14.0 (H) 4.0 - 10.5 K/uL   RBC 3.68 (L) 3.87 - 5.11 MIL/uL   Hemoglobin 10.0 (L) 12.0 - 15.0 g/dL   HCT 96.029.9 (L) 45.436.0 - 09.846.0 %   MCV 81.3 78.0 - 100.0 fL   MCH 27.2 26.0 - 34.0 pg   MCHC 33.4 30.0 - 36.0 g/dL   RDW 11.914.4 14.711.5 - 82.915.5 %   Platelets 228 150 - 400 K/uL   Abdomen is soft and non tender  Impression: PPD #1 Doing well Routine care Discharge home tomorrow

## 2016-06-01 NOTE — Lactation Note (Signed)
This note was copied from a baby's chart. Lactation Consultation Note:  Mother describes infant not cueing very much in the night. Today she is crying and goes to the breast only for a few sucks and then goes to sleep. Mother is concerned about weight loss and poor latch due to slight pinching of her nipple.  Assist mother with latching infant on in football hold on left breast . Mother describes slight pain on initial latch. Observed infant with a wide gape. Lower lip rolled down and slight chin tug for wider gape. Mother continued to describe a pinching feeing.  Infant was placed in cross cradle hold. Infant latched on the same with pain on the initial latch. Infant suckled on and off for 5 mins. No swallows.   Mother concerned about pinching . Discussed the use of a nipple shield due to infants high palate. Mother was given a hand pump and suggested to hand express and then pump . If obtained any colostrum give with a spoon.  Observed that infant has a anterior tight short lingual frenulum. Discussed with mother ,this could be the cause of the pinching.  Discussed the use of formula at any time if parents feel that infant is not getting enough.   Patient Name: Kerry Cyril LoosenJennifer Cupp GEXBM'WToday's Date: 06/01/2016 Reason for consult: Follow-up assessment   Maternal Data    Feeding Feeding Type: Breast Fed Length of feed: 5 min  LATCH Score/Interventions Latch: Grasps breast easily, tongue down, lips flanged, rhythmical sucking.  Audible Swallowing: A few with stimulation  Type of Nipple: Everted at rest and after stimulation  Comfort (Breast/Nipple): Filling, red/small blisters or bruises, mild/mod discomfort  Problem noted: Mild/Moderate discomfort  Hold (Positioning): Assistance needed to correctly position infant at breast and maintain latch. Intervention(s): Support Pillows;Position options  LATCH Score: 7  Lactation Tools Discussed/Used     Consult Status Consult Status:  Follow-up Date: 06/01/16 Follow-up type: In-patient    Stevan BornKendrick, [facility] 06/01/2016, 3:11 PM

## 2016-06-01 NOTE — Anesthesia Postprocedure Evaluation (Signed)
Anesthesia Post Note  Patient: Kerry LoosenJennifer Hernandez  Procedure(s) Performed: * No procedures listed *  Patient location during evaluation: Mother Baby Anesthesia Type: Epidural Level of consciousness: awake and alert Pain management: pain level controlled Vital Signs Assessment: post-procedure vital signs reviewed and stable Respiratory status: spontaneous breathing, nonlabored ventilation and respiratory function stable Cardiovascular status: stable Postop Assessment: no headache, no backache, epidural receding and patient able to bend at knees Anesthetic complications: no        Last Vitals:  Vitals:   06/01/16 0011 06/01/16 0334  BP: 125/81 136/82  Pulse:  64  Resp:  18  Temp:  36.6 C    Last Pain:  Vitals:   06/01/16 0334  TempSrc: Oral  PainSc:    Pain Goal: Patients Stated Pain Goal: 7 (05/31/16 0802)               Rica RecordsICKELTON,Alitza Cowman

## 2016-06-02 ENCOUNTER — Ambulatory Visit: Payer: Self-pay

## 2016-06-02 MED ORDER — IBUPROFEN 600 MG PO TABS: 600.0000 mg | ORAL_TABLET | Freq: Four times a day (QID) | ORAL | 0 refills | Status: AC

## 2016-06-02 NOTE — Discharge Summary (Signed)
Obstetric Discharge Summary Reason for Admission: induction of labor Prenatal Procedures: none Intrapartum Procedures: spontaneous vaginal delivery Postpartum Procedures: none Complications-Operative and Postpartum: 2nd degree perineal laceration Hemoglobin  Date Value Ref Range Status  06/01/2016 10.0 (L) 12.0 - 15.0 g/dL Final   HCT  Date Value Ref Range Status  06/01/2016 29.9 (L) 36.0 - 46.0 % Final    Physical Exam:  General: alert, cooperative and appears stated age 19Lochia: appropriate Uterine Fundus: firm Incision: healing well, no significant drainage, no dehiscence DVT Evaluation: No evidence of DVT seen on physical exam.  Discharge Diagnoses: Term Pregnancy-delivered  Discharge Information: Date: 06/02/2016 Activity: pelvic rest Diet: routine Medications: Ibuprofen Condition: improved Instructions: refer to practice specific booklet Discharge to: home   Newborn Data: Live born female  Birth Weight: 8 lb 9.2 oz (3890 g) APGAR: 9, 9  Home with mother.  Jacinda Kanady L 06/02/2016, 6:46 AM

## 2016-06-02 NOTE — Lactation Note (Signed)
This note was copied from a baby's chart. Lactation Consultation Note: Mother attempting to latch infant. Infant sleepy and not showing any feeding cues.  Taught mother to do baby sit ups to wake infant. Infant woke crying. Mother was able to get infant latched on for a few sucks. Not enough to sustain a latch . Infant was given 20 ml of formula with a SNS and LC's finger. Mother was given supplemental guidelines . Advised to give infant at least 20-30 ml every 2-3 hours. Mother to post pump for 15 mins on each breast. Advised parents to offer supplement to infant every 2-3 hours if unable to latch well.. Mother denies having any questions.   Patient Name: Kerry Cyril LoosenJennifer Rowzee UEAVW'UToday's Date: 06/02/2016 Reason for consult: Follow-up assessment   Maternal Data    Feeding Feeding Type: Formula  LATCH Score/Interventions                      Lactation Tools Discussed/Used     Consult Status Consult Status: Follow-up Date: 06/02/16 Follow-up type: In-patient    Stevan BornKendrick, Ollis Daudelin [facility] 06/02/2016, 2:44 PM

## 2016-06-02 NOTE — Plan of Care (Signed)
Problem: Coping: Goal: Ability to cope will improve Outcome: Completed/Met Date Met: 06/02/16 Baby is not being discharged due to feeding difficulties and weight loss. Mother is coping well with her postpartum course. She is receptive to baby becoming the patient and having additional support, education and assistance with breastfeeding and establishing a feeding plan with Lactation and nursing staff.

## 2016-06-03 ENCOUNTER — Ambulatory Visit: Payer: Self-pay

## 2016-06-03 NOTE — Lactation Note (Signed)
This note was copied from a baby's chart. Lactation Consultation Note  Patient Name: Kerry Hernandez ZOXWR'UToday's Date: 06/03/2016 Reason for consult: Follow-up assessment Baby sleepy at this visit, Mom trying to latch baby in cross cradle position, breasts are starting to fill. LC notes when baby obtains latch looks to be shallow and baby falling asleep at breast. Baby does have high palate and short anterior lingual frenulum. Some chewing noted at breast and with suck exam LC can feel gum ridge off/on. Parents have been supplementing at breast using 5 fr feeding tube/syringe, attempted to supplement to keep baby interested in nursing, baby took approx 3 ml of EBM. Initiated 24 nipple shield to help baby obtain more depth and help with nipple tenderness Mom experiences with baby at breast. After few attempts baby did latch with nipple shield and developed a more nutritive suckling pattern. Some breast milk in nipple shield but did supplement with 5 fr feeding tube/syringe at breast as well. Baby took 10 ml of formula at breast while nursing with nipple shield.  Mom reported less discomfort with nipple shield. FOB attempted to supplement remaining 10 ml with finger feeding. LC demonstrated to parents how to use bottle with paced feeding to give additional 10 ml of formula to complete supplement. Parents report baby has been sleepy and they have been having to wake baby to BF.  Baby has been to breast 7 times in past 24 hours 20-30 min, supplemented X 6 15-35 ml, 4 voids/2 stools in past 24 hours. Baby has gained weight since parents started to supplement. Plan for d/c discussed: BF with feeding ques but at least 8-12 times in 24 hours. Use nipple shield to latch so baby obtains more depth, look for breast milk in nipple shield with feedings. Supplement at breast using 5 fr feeding tube/syringe or can use Dr. Manson PasseyBrown bottle #1 nipple. If supplementing at breast taking over 30 minutes, use bottle to supplement  using paced feeding as demonstrated. Parents to follow supplemental guidelines per hours of age increasing as needed. Mom to post pump every 3 hours for 15 minutes to encourage milk production/protect milk supply. Pre-pump if needed to help with latch due to breast fullness when milk comes in.  Monitor voids/stools. Engorgement care reviewed if needed, breast milk storage guidelines discussed. OP f/u scheduled with lactation for Friday, 06/07/16 at 2:30 Mom to call for questions/concerns.   Maternal Data    Feeding Feeding Type: Formula Nipple Type: Slow - flow Length of feed: 15 min (initiated nipple shield)  LATCH Score/Interventions Latch: Repeated attempts needed to sustain latch, nipple held in mouth throughout feeding, stimulation needed to elicit sucking reflex. (initiated nipple shield 24) Intervention(s): Adjust position;Assist with latch;Breast massage;Breast compression  Audible Swallowing: A few with stimulation  Type of Nipple: Everted at rest and after stimulation (short nipple shaft bilateral)  Comfort (Breast/Nipple): Filling, red/small blisters or bruises, mild/mod discomfort Intervention(s): Expressed breast milk to nipple  Problem noted: Filling;Mild/Moderate discomfort  Hold (Positioning): Assistance needed to correctly position infant at breast and maintain latch. Intervention(s): Breastfeeding basics reviewed;Support Pillows;Position options;Skin to skin  LATCH Score: 6  Lactation Tools Discussed/Used Tools: Nipple Shields;Pump;96F feeding tube / Syringe Nipple shield size: 24 Breast pump type: Double-Electric Breast Pump   Consult Status Consult Status: Complete Date: 06/03/16 Follow-up type: In-patient    Kerry Hernandez, Kerry Hernandez 06/03/2016, 11:13 AM

## 2016-06-07 ENCOUNTER — Ambulatory Visit (HOSPITAL_COMMUNITY): Payer: BLUE CROSS/BLUE SHIELD

## 2016-06-07 ENCOUNTER — Ambulatory Visit (HOSPITAL_COMMUNITY)
Admission: RE | Admit: 2016-06-07 | Discharge: 2016-06-07 | Disposition: A | Discharge status: Home / Self Care | Payer: BLUE CROSS/BLUE SHIELD | Source: Ambulatory Visit | Attending: Obstetrics and Gynecology | Admitting: Obstetrics and Gynecology

## 2016-06-07 NOTE — Lactation Note (Signed)
Lactation Consult  Mother's reason for visit: slow weight gain, sleepy at breast, jaundice Visit Type:  Outpatient Consult:  Initial Lactation Consultant:  Kerry Hernandez, Kerry Hernandez  ________________________________________________________________________ Kerry FloresBaby's Name:  Kerry CrockerSerenity Marie Hernandez Date of Birth:  05/31/2016 Pediatrician:  Kerry DukeLittle with [facility] Gender:  female Gestational Age: 5683w1d (At Birth) Birth Weight:  8 lb 9.2 oz (3890 g) Weight at Discharge:  Weight: 7 lb 14.8 oz (3595 g)             Date of Discharge:  06/03/2016      Filed Weights   06/02/16 0929 06/02/16 2354 06/03/16 0734  Weight: 7 lb 13.6 oz (3560 g) 7 lb 12.5 oz (3530 g) 7 lb 14.8 oz (3595 g)  [facility]:  7 lbs 14.5 oz    at Pediatrician office 06/05/16  Weight Hernandez: 8 lbs 2.2 oz   Kerry DikeJennifer here Hernandez with 8 day old first baby.  Baby noted to have facial and upper trunk jaundice.  Baby has been supplemented with formula last 2 days due to weight loss and jaundice.  Baby's weight at 8% below birth weight at appointment, which was what baby was on discharge from hospital 2 days prior.  Hernandez, baby is up about 4 oz in 2 days.  Mom had been primarily pumping and bottle with BFing only 2-3 times per day.  Mom desires to exclusively BF as her goal.    Assisted with Mom using football hold, and baby latched on deeply.  Pain level at 1 with latch.  Baby fed on both breasts with little pinching on sides of nipple noted post latch.   Nipple shape rounded and pulled out.  Baby tends to tuck in lower and upper lips.  Reviewed how to untuck lips to increase seal and comfort with the latch.     Baby was able to transfer a total of 54 ml from breast.  20 mins on left and 15 mins on right.    Goal is to wean from supplements and pumping slowly while maintaining weight gain.  Plan- 1- BF baby on cue (goal is 8-12 feeds per 24 hrs) Wide and deep latch, using alternate breast  compression 2- Offer baby 30 ml by slow flow nipple, using Pace Method after every OTHER feeding during day. 3- Pump both breasts after every OTHER feeding as well, unless breasts are full following a feeding 4-If not BFing, offer baby 60-75 ml EBM by slow flow bottle, and pump both breast 15-20 mins 5- Call for any concerns and follow up with LC on 06/12/16 @ 9 am    ________________________________________________________________________  Mother's Name: Kerry LoosenJennifer Hernandez Type of delivery: Vaginal  Breastfeeding Experience:  none Maternal Medical Conditions:  Pregnancy induced hypertension Maternal Medications:  PNV, Motrin, stool softener  ________________________________________________________________________  Breastfeeding History (Post Discharge)  Frequency of breastfeeding:  Last 2 days, 2-3 times a day Duration of feeding: 15-20 minutes  Supplementation  Breastmilk:  Volume 60-80 ml  Frequency:  Every 3 hrs   Method:  Bottle  Pumping  Type of pump:  Medela pump in style Frequency:  Every 3 hrs Volume: 60-80 ml starting  Starting 1/10 after pediatrician appt. ,    Infant Intake and Output Assessment  Voids:  10 in 24 hrs.  Color:  Clear yellow Stools:  6 in 24 hrs.  Color:  Yellow  ________________________________________________________________________  Maternal Breast Assessment  Breast:  Full Nipple:  Erect and Blister Pain level:  1 Pain interventions:  Cream/oil  _______________________________________________________________________ Feeding Assessment/Evaluation  Initial feeding assessment:  Infant's oral assessment:  Variance -short lingual frenulum noted, and arched palate  Positioning:  Football Left breast  LATCH documentation:  Latch:  2 = Grasps breast easily, tongue down, lips flanged, rhythmical sucking.  Audible swallowing:  2 = Spontaneous and intermittent  Type of nipple:  2 = Everted at rest and after stimulation  Comfort  (Breast/Nipple):  1 = Filling, red/small blisters or bruises, mild/mod discomfort  Hold (Positioning):  1 = Assistance needed to correctly position infant at breast and maintain latch  LATCH score:  8  Attached assessment:  Deep  Lips flanged:  No.  Lips untucked:  Yes.    Suck assessment:  Nutritive    Pre-feed weight: 3692 g   Post-feed weight:  3718 g  Amount transferred:  26 ml   Additional Feeding Assessment -   Infant's oral assessment:  Variance  Positioning:  Football Right breast  LATCH documentation:  Latch:  2 = Grasps breast easily, tongue down, lips flanged, rhythmical sucking.  Audible swallowing:  2 = Spontaneous and intermittent  Type of nipple:  2 = Everted at rest and after stimulation  Comfort (Breast/Nipple):  1 = Filling, red/small blisters or bruises, mild/mod discomfort  Hold (Positioning):  2 = No assistance needed to correctly position infant at breast  LATCH score:  9  Attached assessment:  Deep  Lips flanged:  Yes.    Lips untucked:  Yes.    Suck assessment:  Nutritive  Pre-feed weight:  3718 g   Post-feed weight:  3746 g  Amount transferred: 28 ml   Total amount pumped post feed:  42 ml  Total amount transferred:  54 ml Total supplement given:  0 ml

## 2016-06-12 ENCOUNTER — Ambulatory Visit (HOSPITAL_COMMUNITY): Payer: BLUE CROSS/BLUE SHIELD

## 2016-06-12 ENCOUNTER — Inpatient Hospital Stay (HOSPITAL_COMMUNITY): Admission: RE | Admit: 2016-06-12 | Payer: BLUE CROSS/BLUE SHIELD | Source: Ambulatory Visit

## 2016-06-19 ENCOUNTER — Ambulatory Visit (HOSPITAL_COMMUNITY)
Admission: RE | Admit: 2016-06-19 | Discharge: 2016-06-19 | Disposition: A | Discharge status: Home / Self Care | Payer: BLUE CROSS/BLUE SHIELD | Source: Ambulatory Visit | Attending: Obstetrics and Gynecology | Admitting: Obstetrics and Gynecology

## 2016-06-19 NOTE — Lactation Note (Signed)
Lactation Consult for Kerry Hernandez (DOB: [birth year]) & Kerry Hernandez (mother)    Mother's reason for visit: F/u from 05-07-17 and issue w/pumping Consult:  Follow-Up Lactation Consultant:  Remigio Eisenmenger ___________________________________________________________________ BW: 8# 9.2 oz 06-03-16: 7# 14.8oz 06-05-16: 7# 14oz 06-07-16: 8# 2.2oz 06-14-16: 8# 9oz Today's weight: 8# 14.6oz ________________________________________________________________________  Mother's Name: Kerry Hernandez Type of delivery:   Breastfeeding Experience: primip Maternal Medical Conditions:  Pregnancy induced hypertension, but BP is fine, now Maternal Medications: PNV, IB, stool softener  ________________________________________________________________________  Breastfeeding History (Post Discharge)  Frequency of breastfeeding: q2.5hr-3hr. Every other feeding is followed by 30mL bottle of EBM unless it is clear that Serenity fed really well.  Duration of feeding: 25-30 min  Pumping  Type of pump:  Medela pump in style Frequency:  After every other feeding during the day & q3h at night (15 min) Volume: 55-85 mL   after feeding, 70-140 mL if not after feeding  Bottle feeding. Dr. Theora Gianotti Level 1 3 oz/3 bottles at night & see note above.  Infant Intake and Output Assessment  Voids: 8 in 24 hrs.  Color:  Clear yellow Stools: 4-6 in 24 hrs.  Color:  Yellow  ________________________________________________________________________  Maternal Breast Assessment  Breast:  Full Nipple:  Erect  _______________________________________________________________________ Feeding Assessment/Evaluation  Initial feeding assessment:  Infant's oral assessment:  Variance Cobblestoning noted on upper lip.   Attached assessment:  Deep  Lips flanged:  Yes.  Although needed to help w/lower lip   Suck assessment:  Displays both  Pre-feed weight: 4042g Post-feed weight: 4080 g Amount transferred: 38  ml R breast, 7 min  Pre-feed weight: 4080 g   Post-feed weight: 4104 g  Amount transferred: 24 ml 11 min; R breast  Pre-feed weight: 4104 g   Post-feed weight: 4140 g  Amount transferred: 36 ml 5 min, L breast  Total amount pumped post feed: Not measured, we were pumping only for checking flange size  Total amount transferred:  98 ml   Serenity is 78 days old and is 5+ oz above BW. Serenity's ability at the breast has improved since her last LC visit (on 06-07-16). Today, Serenity transferred 98 mL in a total of 23 minutes. Almost 2 weeks ago, she transferred 54 mL within 35 minutes. Mom concurs that Serenity's feeding at the breast has improved. Serenity latches with relative ease, although she needs assistance having her lower lip untucked. Cobblestoning was observed on the top lip, but the top lip was not observed to be tucked during feedings. Parents/infant saw Dr. Jenne Pane yesterday at [facility]. He did not feel that there was an upper lip tie. He felt that a frenotomy could be done if Mom had discomfort w/feeding, but Mom no longer has discomfort w/feeding & infant's weight gain is on target.   Of note, parents do report some choking w/feeding (every other feeding when at the breast) and when being bottle-fed (using paced-feeding method).  Dad reports that she chokes about twice during a bottle-feeding, but it is immediately rectified when they put her up on their shoulder. Parents feel that the choking occurs when Serenity has fallen asleep at the breast/with the bottle (with milk in her mouth) and is then reawakened. It takes Serenity 15-20 min to take a 3-oz bottle, which is appropriate for a paced-feeding method. Some louder swallowing was noted on the R breast, but not on the L side (which was a shorter feeding, but transferred just as much).  At her last LC visit,  Mom was switched to a size 24 flange (from a size 27). However, Mom is still having discomfort w/pumping (even with  lubricating the inside of the flange with coconut oil). I observed her pumping with a size 24 flange and it was pulling in more areola than necessary. Although on visual exam (without pumping), Mom appears to be a size 24, a size 21 flange was tried. Mom felt more comfortable with pumping. The size 21 was neither too tight nor did it pull in areola distal to the nipple. Mom understands that if she continues to have any flange issues w/pumping, then she should consider the Pumpin' Pals (medium set).   Mom was encouraged to f/u w/our BFSGs for weight checks, as desired, or if she tweaks Serenity's feeding routine (e.g. she begins sleeping longer through the night, etc.).  Glenetta HewKim Colman Birdwell, RN, IBCLC

## 2018-02-25 LAB — OB RESULTS CONSOLE HEPATITIS B SURFACE ANTIGEN: Hepatitis B Surface Ag: NEGATIVE

## 2018-02-25 LAB — OB RESULTS CONSOLE HIV ANTIBODY (ROUTINE TESTING): HIV: NONREACTIVE

## 2018-02-25 LAB — OB RESULTS CONSOLE ANTIBODY SCREEN: Antibody Screen: NEGATIVE

## 2018-02-25 LAB — OB RESULTS CONSOLE RUBELLA ANTIBODY, IGM: Rubella: IMMUNE

## 2018-02-25 LAB — OB RESULTS CONSOLE RPR: RPR: NONREACTIVE

## 2018-02-25 LAB — OB RESULTS CONSOLE ABO/RH: RH Type: POSITIVE

## 2018-02-25 LAB — OB RESULTS CONSOLE GC/CHLAMYDIA
Chlamydia: NEGATIVE
Gonorrhea: NEGATIVE

## 2018-09-02 LAB — OB RESULTS CONSOLE GBS: GBS: NEGATIVE

## 2018-09-21 ENCOUNTER — Telehealth (HOSPITAL_COMMUNITY): Payer: Self-pay | Admitting: *Deleted

## 2018-09-21 ENCOUNTER — Encounter (HOSPITAL_COMMUNITY): Payer: Self-pay | Admitting: *Deleted

## 2018-09-21 NOTE — Telephone Encounter (Signed)
Preadmission screen  

## 2018-09-28 ENCOUNTER — Other Ambulatory Visit (HOSPITAL_COMMUNITY): Payer: Self-pay | Admitting: *Deleted

## 2018-09-28 NOTE — H&P (Signed)
Kerry Hernandez is a 38 y.o. female presenting for elective IOL.  Pregnancy uncomplicated.  OB History    Gravida  3   Para  1   Term  1   Preterm      AB  1   Living  1     SAB  1   TAB      Ectopic      Multiple  0   Live Births  1          Past Medical History:  Diagnosis Date  . Headache    Migraines  . Hx of varicella   . IBS (irritable bowel syndrome)   . PONV (postoperative nausea and vomiting)   . Rhinitis, non-allergic    Past Surgical History:  Procedure Laterality Date  . DILATION AND EVACUATION N/A 05/05/2015   Procedure: DILATATION AND EVACUATION;  Surgeon: Zelphia Cairo, MD;  Location: [facility];  Service: Gynecology;  Laterality: N/A;  . WISDOM TOOTH EXTRACTION     Family History: family history includes COPD in her maternal grandmother; Diabetes in her father, maternal grandmother, and paternal grandfather; Hypertension in her mother; Stroke in her maternal grandfather and maternal grandmother. Social History:  reports that she has never smoked. She has never used smokeless tobacco. She reports that she does not drink alcohol or use drugs.     Maternal Diabetes: No Genetic Screening: Normal Maternal Ultrasounds/Referrals: Normal Fetal Ultrasounds or other Referrals:  None Maternal Substance Abuse:  No Significant Maternal Medications:  None Significant Maternal Lab Results:  None Other Comments:  None  ROS History   Exam Physical Exam  Gen - NAD ABd - gravid, NT Ext - NT Cvx 1.5cm Prenatal labs: ABO, Rh: A/Positive/-- (10/02 0000) Antibody: Negative (10/02 0000) Rubella: Immune (10/02 0000) RPR: Nonreactive (10/02 0000)  HBsAg: Negative (10/02 0000)  HIV: Non-reactive (10/02 0000)  GBS:   negative 09/02/18  Assessment/Plan: Admit Cytotec/pitocin IOL   Zelphia Cairo 09/28/2018, 2:47 PM

## 2018-09-29 ENCOUNTER — Other Ambulatory Visit: Payer: Self-pay

## 2018-09-29 ENCOUNTER — Encounter (HOSPITAL_COMMUNITY): Payer: Self-pay

## 2018-09-29 ENCOUNTER — Inpatient Hospital Stay (HOSPITAL_COMMUNITY): Payer: BLUE CROSS/BLUE SHIELD | Admitting: Anesthesiology

## 2018-09-29 ENCOUNTER — Inpatient Hospital Stay (HOSPITAL_COMMUNITY)
Admission: AD | Admit: 2018-09-29 | Discharge: 2018-10-01 | DRG: 807 | Disposition: A | Discharge status: Home / Self Care | Payer: BLUE CROSS/BLUE SHIELD | Attending: Obstetrics and Gynecology | Admitting: Obstetrics and Gynecology

## 2018-09-29 ENCOUNTER — Inpatient Hospital Stay (HOSPITAL_COMMUNITY): Payer: BLUE CROSS/BLUE SHIELD

## 2018-09-29 ENCOUNTER — Encounter (HOSPITAL_COMMUNITY): Payer: Self-pay | Admitting: Anesthesiology

## 2018-09-29 DIAGNOSIS — O26893 Other specified pregnancy related conditions, third trimester: Secondary | ICD-10-CM | POA: Diagnosis present

## 2018-09-29 DIAGNOSIS — Z3A39 39 weeks gestation of pregnancy: Secondary | ICD-10-CM | POA: Diagnosis not present

## 2018-09-29 LAB — CBC
HCT: 37.9 % (ref 36.0–46.0)
Hemoglobin: 11.9 g/dL — ABNORMAL LOW (ref 12.0–15.0)
MCH: 25.2 pg — ABNORMAL LOW (ref 26.0–34.0)
MCHC: 31.4 g/dL (ref 30.0–36.0)
MCV: 80.1 fL (ref 80.0–100.0)
Platelets: 307 10*3/uL (ref 150–400)
RBC: 4.73 MIL/uL (ref 3.87–5.11)
RDW: 14.3 % (ref 11.5–15.5)
WBC: 10.7 10*3/uL — ABNORMAL HIGH (ref 4.0–10.5)
nRBC: 0 % (ref 0.0–0.2)

## 2018-09-29 LAB — RPR: RPR Ser Ql: NONREACTIVE

## 2018-09-29 LAB — ABO/RH: ABO/RH(D): A POS

## 2018-09-29 LAB — TYPE AND SCREEN
ABO/RH(D): A POS
Antibody Screen: NEGATIVE

## 2018-09-29 MED ORDER — IBUPROFEN 600 MG PO TABS
600.0000 mg | ORAL_TABLET | Freq: Four times a day (QID) | ORAL | Status: DC
  Administered 2018-09-29 – 2018-10-01 (×8): 600 mg via ORAL
  Filled 2018-09-29 (×8): qty 1

## 2018-09-29 MED ORDER — SIMETHICONE 80 MG PO CHEW: 80.0000 mg | CHEWABLE_TABLET | ORAL | Status: DC | PRN

## 2018-09-29 MED ORDER — EPHEDRINE 5 MG/ML INJ: 10.0000 mg | INTRAVENOUS | Status: DC | PRN

## 2018-09-29 MED ORDER — OXYTOCIN 40 UNITS IN NORMAL SALINE INFUSION - SIMPLE MED: 2.5000 [IU]/h | INTRAVENOUS | Status: DC

## 2018-09-29 MED ORDER — COCONUT OIL OIL
1.0000 "application " | TOPICAL_OIL | Status: DC | PRN
  Administered 2018-09-29: 1 via TOPICAL

## 2018-09-29 MED ORDER — ONDANSETRON HCL 4 MG/2ML IJ SOLN: 4.0000 mg | INTRAMUSCULAR | Status: DC | PRN

## 2018-09-29 MED ORDER — ONDANSETRON HCL 4 MG PO TABS: 4.0000 mg | ORAL_TABLET | ORAL | Status: DC | PRN

## 2018-09-29 MED ORDER — ACETAMINOPHEN 325 MG PO TABS: 650.0000 mg | ORAL_TABLET | ORAL | Status: DC | PRN

## 2018-09-29 MED ORDER — FENTANYL-BUPIVACAINE-NACL 0.5-0.125-0.9 MG/250ML-% EP SOLN
12.0000 mL/h | EPIDURAL | Status: DC | PRN
  Filled 2018-09-29: qty 250

## 2018-09-29 MED ORDER — LIDOCAINE HCL (PF) 1 % IJ SOLN
30.0000 mL | INTRAMUSCULAR | Status: AC | PRN
  Administered 2018-09-29: 5 mL via SUBCUTANEOUS

## 2018-09-29 MED ORDER — TERBUTALINE SULFATE 1 MG/ML IJ SOLN: 0.2500 mg | Freq: Once | INTRAMUSCULAR | Status: DC | PRN

## 2018-09-29 MED ORDER — OXYCODONE-ACETAMINOPHEN 5-325 MG PO TABS: 2.0000 | ORAL_TABLET | ORAL | Status: DC | PRN

## 2018-09-29 MED ORDER — LACTATED RINGERS IV SOLN: 500.0000 mL | Freq: Once | INTRAVENOUS | Status: DC

## 2018-09-29 MED ORDER — DIPHENHYDRAMINE HCL 25 MG PO CAPS: 25.0000 mg | ORAL_CAPSULE | Freq: Four times a day (QID) | ORAL | Status: DC | PRN

## 2018-09-29 MED ORDER — LACTATED RINGERS IV SOLN: 500.0000 mL | INTRAVENOUS | Status: DC | PRN

## 2018-09-29 MED ORDER — FENTANYL 2.5 MCG/ML BUPIVACAINE 1/10 % EPIDURAL INFUSION (WH - ANES): INTRAMUSCULAR | Status: DC | PRN

## 2018-09-29 MED ORDER — MEASLES, MUMPS & RUBELLA VAC IJ SOLR: 0.5000 mL | Freq: Once | INTRAMUSCULAR | Status: DC

## 2018-09-29 MED ORDER — SENNOSIDES-DOCUSATE SODIUM 8.6-50 MG PO TABS
2.0000 | ORAL_TABLET | ORAL | Status: DC
  Administered 2018-09-29 – 2018-09-30 (×2): 2 via ORAL
  Filled 2018-09-29 (×2): qty 2

## 2018-09-29 MED ORDER — OXYTOCIN 40 UNITS IN NORMAL SALINE INFUSION - SIMPLE MED
1.0000 m[IU]/min | INTRAVENOUS | Status: DC
  Administered 2018-09-29: 2 m[IU]/min via INTRAVENOUS
  Filled 2018-09-29: qty 1000

## 2018-09-29 MED ORDER — BENZOCAINE-MENTHOL 20-0.5 % EX AERO
1.0000 "application " | INHALATION_SPRAY | CUTANEOUS | Status: DC | PRN
  Administered 2018-09-29: 1 via TOPICAL
  Filled 2018-09-29: qty 56

## 2018-09-29 MED ORDER — PHENYLEPHRINE 40 MCG/ML (10ML) SYRINGE FOR IV PUSH (FOR BLOOD PRESSURE SUPPORT): 80.0000 ug | PREFILLED_SYRINGE | INTRAVENOUS | Status: DC | PRN

## 2018-09-29 MED ORDER — ONDANSETRON HCL 4 MG/2ML IJ SOLN: 4.0000 mg | Freq: Four times a day (QID) | INTRAMUSCULAR | Status: DC | PRN

## 2018-09-29 MED ORDER — LACTATED RINGERS IV SOLN
INTRAVENOUS | Status: DC
  Administered 2018-09-29: via INTRAVENOUS

## 2018-09-29 MED ORDER — DIBUCAINE (PERIANAL) 1 % EX OINT: 1.0000 "application " | TOPICAL_OINTMENT | CUTANEOUS | Status: DC | PRN

## 2018-09-29 MED ORDER — TETANUS-DIPHTH-ACELL PERTUSSIS 5-2.5-18.5 LF-MCG/0.5 IM SUSP: 0.5000 mL | Freq: Once | INTRAMUSCULAR | Status: DC

## 2018-09-29 MED ORDER — OXYCODONE-ACETAMINOPHEN 5-325 MG PO TABS: 1.0000 | ORAL_TABLET | ORAL | Status: DC | PRN

## 2018-09-29 MED ORDER — BUTORPHANOL TARTRATE 1 MG/ML IJ SOLN: 1.0000 mg | INTRAMUSCULAR | Status: DC | PRN

## 2018-09-29 MED ORDER — PRENATAL MULTIVITAMIN CH
1.0000 | ORAL_TABLET | Freq: Every day | ORAL | Status: DC
  Administered 2018-09-29 – 2018-10-01 (×3): 1 via ORAL
  Filled 2018-09-29 (×3): qty 1

## 2018-09-29 MED ORDER — MEDROXYPROGESTERONE ACETATE 150 MG/ML IM SUSP: 150.0000 mg | INTRAMUSCULAR | Status: DC | PRN

## 2018-09-29 MED ORDER — OXYTOCIN BOLUS FROM INFUSION
500.0000 mL | Freq: Once | INTRAVENOUS | Status: AC
  Administered 2018-09-29: 12:00:00 500 mL via INTRAVENOUS

## 2018-09-29 MED ORDER — DIPHENHYDRAMINE HCL 50 MG/ML IJ SOLN: 12.5000 mg | INTRAMUSCULAR | Status: DC | PRN

## 2018-09-29 MED ORDER — SOD CITRATE-CITRIC ACID 500-334 MG/5ML PO SOLN: 30.0000 mL | ORAL | Status: DC | PRN

## 2018-09-29 MED ORDER — WITCH HAZEL-GLYCERIN EX PADS
1.0000 "application " | MEDICATED_PAD | CUTANEOUS | Status: DC | PRN
  Administered 2018-09-29: 1 via TOPICAL

## 2018-09-29 MED ORDER — MISOPROSTOL 25 MCG QUARTER TABLET
25.0000 ug | ORAL_TABLET | ORAL | Status: AC | PRN
  Administered 2018-09-29 (×2): 25 ug via VAGINAL
  Filled 2018-09-29 (×2): qty 1

## 2018-09-29 NOTE — Progress Notes (Signed)
Pt s/p cytotec overnight.  Feeling occ ctx.  No vb or lof.   FHT cat 1 Toco occasional Cvx 2/50/-2, vtx AROM - scant, clear fluid  A/P:  IOL - start pitocin Epidural prn Exp mngt

## 2018-09-29 NOTE — Anesthesia Postprocedure Evaluation (Signed)
Anesthesia Post Note  Patient: Kerry Hernandez  Procedure(s) Performed: AN AD HOC LABOR EPIDURAL     Patient location during evaluation: Mother Baby Anesthesia Type: Epidural Level of consciousness: awake and alert and oriented Pain management: satisfactory to patient Vital Signs Assessment: post-procedure vital signs reviewed and stable Respiratory status: spontaneous breathing and nonlabored ventilation Cardiovascular status: stable Postop Assessment: no headache, no backache, no signs of nausea or vomiting, adequate PO intake, patient able to bend at knees and able to ambulate (patient up walking) Anesthetic complications: no    Last Vitals:  Vitals:   09/29/18 1415 09/29/18 1530  BP: (!) 142/68 133/75  Pulse: 80 88  Resp: 16 18  Temp: 36.9 C 37 C  SpO2:      Last Pain:  Vitals:   09/29/18 1645  TempSrc:   PainSc: 3    Pain Goal: Patients Stated Pain Goal: 2 (09/29/18 1645)                 Madison Hickman

## 2018-09-29 NOTE — Lactation Note (Signed)
This note was copied from a baby's chart. Lactation Consultation Note  Patient Name: Kerry Hernandez [id number]'L Date: 09/29/2018 Reason for consult: Initial assessment;Other (Comment);Term(AMA)  7 hours old FT female who is being exclusively BF by his mother, she's a P2. Mom experienced BF challenges with her first baby, she had a lip and a tongue tie; but voiced that this baby feels different, she feels more like he's tugging instead of pinching like it happened with her daughter, baby # 1.  Reviewed hand expression with mom, she was complaining of sore nipples, the right one looked intact upon examination but the left one had small scabs, mom told LC that it was bleeding earlier from the last feeding. Reviewed prevention and treatment for sore nipples and when LC assisted mom with hand expression, she rubbed it on her nipples. Noticed some areola edema, mom's tissue is non compressible at this point. LC also requested the front desk to get coconut oil for mom and instructed her how to use it with the breast shells. Breast shells instructions, cleaning and storage were reviewed.  Offered assistance with latch and mom agreed to feed baby STS. LC took baby STS to mom's right breast in football position and he was able to latch almost right away. Showed mom how to reposition baby to achieve a deep latch, LC also showed mom how to break the latch if needed. Audible swallows noted upon breast compressions, mom voiced this particular feeding wasn't as painful as the other ones, baby fed for a total of 12 minutes and nipple looked rounded at the end of the feeding, without any signs of trauma. Reviewed normal newborn behavior, cluster feeding and feeding cues. Right before LC left baby started cueing again and asked again for assistance. LC latched baby to the other breast, in the same football hold and mom voiced again this feeding was comfortable. Baby still nursing when exiting the room.  Feeding plan:  1.  Encouraged mom to feed baby STS 8-12 times/24 hours or sooner if feeding cues are present 2. Hand expression and spoon feeding were also encouraged, mom was provided with snappies  3. She'll start wearing her breast shells tomorrow, she brought a nursing bra to the hospital; will use her colostrum as her # 1 remedy for sore nipples and coconut oil for breast care 4. Mom will let her RN know if she wants to start pumping while at the hospital to be set up with a DEBP, not ready yet, but if soreness continues she may take a break from BF for the next 24 hours and pump instead  BF brochure, BF resources and feeding diary were reviewed. Parents reported all questions and concerns were answered, they're both aware of LC OP services and will call PRN.  Maternal Data Formula Feeding for Exclusion: No Has patient been taught Hand Expression?: Yes Does the patient have breastfeeding experience prior to this delivery?: Yes  Feeding Feeding Type: Breast Fed  LATCH Score Latch: Grasps breast easily, tongue down, lips flanged, rhythmical sucking.  Audible Swallowing: A few with stimulation  Type of Nipple: Everted at rest and after stimulation  Comfort (Breast/Nipple): Filling, red/small blisters or bruises, mild/mod discomfort(left nipple was sore but no signs or trauma, right one had scabs)  Hold (Positioning): Assistance needed to correctly position infant at breast and maintain latch.  LATCH Score: 7  Interventions Interventions: Breast feeding basics reviewed;Assisted with latch;Skin to skin;Breast massage;Hand express;Breast compression;Reverse pressure;Shells;Coconut oil;Support pillows;Adjust position  Lactation Tools Discussed/Used  Tools: Shells;Coconut oil Shell Type: Inverted WIC Program: No   Consult Status Consult Status: Follow-up Date: 09/30/18 Follow-up type: In-patient    Emmit Oriley Venetia ConstableS Batoul Limes 09/29/2018, 7:07 PM

## 2018-09-29 NOTE — Anesthesia Procedure Notes (Deleted)
Epidural Patient location during procedure: OB Start time: 09/29/2018 9:46 AM End time: 09/29/2018 9:59 AM  Staffing Anesthesiologist: Trevor Iha, MD Performed: anesthesiologist   Preanesthetic Checklist Completed: patient identified, site marked, surgical consent, pre-op evaluation, timeout performed, IV checked, risks and benefits discussed and monitors and equipment checked  Epidural Patient position: sitting Prep: site prepped and draped and DuraPrep Patient monitoring: continuous pulse ox and blood pressure Approach: midline Location: L3-L4 Injection technique: LOR air  Needle:  Needle type: Tuohy  Needle gauge: 17 G Needle length: 9 cm and 9 Needle insertion depth: 5 cm cm Catheter type: closed end flexible Catheter size: 19 Gauge Catheter at skin depth: 10 cm Test dose: negative  Assessment Events: blood not aspirated, injection not painful, no injection resistance, negative IV test and no paresthesia  Additional Notes Patient identified. Risks/Benefits/Options discussed with patient including but not limited to bleeding, infection, nerve damage, paralysis, failed block, incomplete pain control, headache, blood pressure changes, nausea, vomiting, reactions to medication both or allergic, itching and postpartum back pain. Confirmed with bedside nurse the patient's most recent platelet count. Confirmed with patient that they are not currently taking any anticoagulation, have any bleeding history or any family history of bleeding disorders. Patient expressed understanding and wished to proceed. All questions were answered. Sterile technique was used throughout the entire procedure. Please see nursing notes for vital signs. Test dose was given through epidural needle and negative prior to continuing to dose epidural or start infusion. Warning signs of high block given to the patient including shortness of breath, tingling/numbness in hands, complete motor block, or any  concerning symptoms with instructions to call for help. Patient was given instructions on fall risk and not to get out of bed. All questions and concerns addressed with instructions to call with any issues. 1 Attempt (S) . Patient tolerated procedure well.

## 2018-09-29 NOTE — Progress Notes (Addendum)
Patient ID: Kerry Hernandez, female   DOB: Nov 09, 1980, 38 y.o.   MRN: [id number]  I was called by L&D staff to standby for Dr Renaldo Fiddler as this pt progressed to complete rapidly and was feeling a strong urge to push. Upon my arrival, vtx was at +3 station and the FHR was dropping to the 60s with each ctx x 20+ seconds each. I had pt push with the two contractions that I was in the room, and with the second push she progressed to SVD in LOA position. There was a moderately tight nuchal cord x 1 that was unable to reduced prior to delivery, so the baby was delivered through the cord and it was unwound after the body was out. Infant dried and placed on pt's abd, then care turned over to Dr Renaldo Fiddler.  [name] CNM 09/29/2018 12:05 PM

## 2018-09-29 NOTE — Anesthesia Preprocedure Evaluation (Deleted)
Anesthesia Evaluation  Patient identified by MRN, date of birth, ID band Patient awake    Reviewed: Allergy & Precautions, NPO status , Patient's Chart, lab work & pertinent test results  History of Anesthesia Complications (+) PONV  Airway Mallampati: II  TM Distance: >3 FB Neck ROM: Full    Dental no notable dental hx. (+) Teeth Intact   Pulmonary neg pulmonary ROS,    Pulmonary exam normal breath sounds clear to auscultation       Cardiovascular Exercise Tolerance: Good Normal cardiovascular exam Rhythm:Regular Rate:Normal     Neuro/Psych  Headaches,    GI/Hepatic negative GI ROS, Neg liver ROS,   Endo/Other    Renal/GU      Musculoskeletal   Abdominal   Peds  Hematology Hgb 11.9 Plt 307   Anesthesia Other Findings   Reproductive/Obstetrics (+) Pregnancy                             Anesthesia Physical Anesthesia Plan  ASA: II  Anesthesia Plan: Epidural   Post-op Pain Management:    Induction:   PONV Risk Score and Plan:   Airway Management Planned:   Additional Equipment:   Intra-op Plan:   Post-operative Plan:   Informed Consent: I have reviewed the patients History and Physical, chart, labs and discussed the procedure including the risks, benefits and alternatives for the proposed anesthesia with the patient or authorized representative who has indicated his/her understanding and acceptance.       Plan Discussed with:   Anesthesia Plan Comments: (For LEA )        Anesthesia Quick Evaluation

## 2018-09-29 NOTE — Progress Notes (Signed)
SVD of vigorous female infant w/ apgars of 9,9.  Infant delivered by CNM upon my arrival to room. Placenta delivered spontaneous w/ 3VC.   1st degree lac repaired w/ 3-0 vicryl rapide.  Fundus firm.  EBL 369cc.

## 2018-09-30 LAB — CBC
HCT: 34.4 % — ABNORMAL LOW (ref 36.0–46.0)
Hemoglobin: 10.9 g/dL — ABNORMAL LOW (ref 12.0–15.0)
MCH: 25.3 pg — ABNORMAL LOW (ref 26.0–34.0)
MCHC: 31.7 g/dL (ref 30.0–36.0)
MCV: 80 fL (ref 80.0–100.0)
Platelets: 252 10*3/uL (ref 150–400)
RBC: 4.3 MIL/uL (ref 3.87–5.11)
RDW: 14.4 % (ref 11.5–15.5)
WBC: 11.1 10*3/uL — ABNORMAL HIGH (ref 4.0–10.5)
nRBC: 0 % (ref 0.0–0.2)

## 2018-09-30 NOTE — Progress Notes (Signed)
Post Partum Day 1 Subjective: no complaints, up ad lib, voiding and tolerating PO  Objective: Blood pressure 130/79, pulse 78, temperature 98.2 F (36.8 C), temperature source Oral, resp. rate 18, height 5\' 9"  (1.753 m), weight 106.6 kg, last menstrual period 12/26/2017, SpO2 100 %, unknown if currently breastfeeding.  Physical Exam:  General: alert, cooperative, appears stated age and no distress Lochia: appropriate Uterine Fundus: firm Incision: healing well DVT Evaluation: No evidence of DVT seen on physical exam.  Recent Labs    09/29/18 0019 09/30/18 0547  HGB 11.9* 10.9*  HCT 37.9 34.4*    Assessment/Plan: Plan for discharge tomorrow, Breastfeeding and Circumcision prior to discharge   LOS: 1 day   Kerry Hernandez 09/30/2018, 9:55 AM

## 2018-10-01 NOTE — Lactation Note (Signed)
This note was copied from a baby's chart. Lactation Consultation Note Baby 40 hrs old. Mom BF her daughter, had a lot of pain while BF. Mom had her daughter's tongue tie clipped at 63 months of age. Mom called for LC to assess baby's mouth for tongue and lip tie. Baby appears to have labial tight frenulum.  Baby has movement of tongue past gums to lips. LC thinks it may have slight posterior.  LC feels that some of the main problem is that mom has very large nipples. Baby does have a wide flange. Mom has baby in cross cradle position, great body alignment, and support. Heard swallows. No breast change noted after feeding. Mom has generalized edema. Mom has compressible nipples and areolas. Encouraged breast massage.  Mom wants to be seen again before d/c after wts. And MD f/u.  Patient Name: Kerry Hernandez [id number] Date: 10/01/2018 Reason for consult: Mother's request;Nipple pain/trauma   Maternal Data    Feeding Feeding Type: Breast Fed  LATCH Score Latch: Grasps breast easily, tongue down, lips flanged, rhythmical sucking.  Audible Swallowing: Spontaneous and intermittent  Type of Nipple: Everted at rest and after stimulation  Comfort (Breast/Nipple): Filling, red/small blisters or bruises, mild/mod discomfort  Hold (Positioning): No assistance needed to correctly position infant at breast.  LATCH Score: 9  Interventions Interventions: Breast compression;Comfort gels;Support pillows;Breast massage;Position options;Hand express  Lactation Tools Discussed/Used Tools: Coconut oil;Comfort gels   Consult Status Consult Status: Follow-up Date: 10/01/18 Follow-up type: In-patient    Kerry Hernandez, Diamond Nickel 10/01/2018, 4:09 AM

## 2018-10-01 NOTE — Discharge Summary (Signed)
Obstetric Discharge Summary Reason for Admission: induction of labor Prenatal Procedures: none Intrapartum Procedures: spontaneous vaginal delivery Postpartum Procedures: none Complications-Operative and Postpartum: 2nd degree perineal laceration Hemoglobin  Date Value Ref Range Status  09/30/2018 10.9 (L) 12.0 - 15.0 g/dL Final   HCT  Date Value Ref Range Status  09/30/2018 34.4 (L) 36.0 - 46.0 % Final    Physical Exam:  General: alert, cooperative and appears stated age 38: appropriate Uterine Fundus: firm Incision: healing well, no significant drainage, no dehiscence, no significant erythema DVT Evaluation: No evidence of DVT seen on physical exam.  Discharge Diagnoses: Term Pregnancy-delivered  Discharge Information: Date: 10/01/2018 Activity: pelvic rest Diet: routine Medications: Ibuprofen Condition: improved Instructions: refer to practice specific booklet Discharge to: home   Newborn Data: Live born female  Birth Weight: 9 lb 10.1 oz (4369 g) APGAR: 9, 9  Newborn Delivery   Birth date/time:  09/29/2018 11:58:00 Delivery type:  Vaginal, Spontaneous     Home with mother.  Jeani Hawking 10/01/2018, 8:27 AM

## 2018-10-01 NOTE — Lactation Note (Signed)
This note was copied from a baby's chart. Lactation Consultation Note  Patient Name: Kerry Hernandez [id number] Date: 10/01/2018 Reason for consult: Follow-up assessment;Nipple pain/trauma;Term;Infant weight loss  Visited with P2 Mom of term baby at 64 hrs old.  Baby noted to have a weight loss of 8.3% yesterday at under 24 hrs old.  Today weight loss is 12.6% from birth weight.  Concern about an error in original birth weight.  Baby's output has been great, with stools turning greenish yellow.    Mom does have sore nipples, and some abraded areas on tips.  Baby crying after Pediatrician exam, and watched Mom position baby in cross cradle hold, adjusting her hand support back away from areola, and hand support of baby's head a tad higher to ear to ear.  Baby opened widely and latched without much discomfort.  Showed Mom and FOB how to assess lower lip.  Pulled down on chin to open mouth wider, and fully flange lower lip.  Baby able to attain a deep areolar grasp to breast.  Baby maintained a nutritive suck pattern for 15 mins while in room.  Regular swallows identified for Mom.  Baby relaxed while feeding, and baby did not need any stimulation to continue feeding.  Mom taught to use alternate breast compression to increase milk transfer.  Breasts feel fuller today, and colostrum easily expressed.    Basic reviewed.  Engorgement prevention and treatment reviewed.   Mom has a double pump at home.   Suggested some hand expression into a spoon after or before each feeding to supplement baby.  Mom agreeable.    Mom aware of OP Lactation support available to her.  Mom aware that soreness of her nipples, should not get worse, but she should be feeling better.  Latch should not hurt entire feeding, and nipple should not look pinched when baby comes off.    Encouraged Mom to call for an OP appointment to assess suck/tongue anatomy if baby's weight loss doesn't resolve and/or soreness continues into second week  of life or gets worse.  First baby had frenulum laser surgery at 6 months.     Interventions Interventions: Breast feeding basics reviewed;Assisted with latch;Skin to skin;Breast massage;Hand express;Support pillows;Adjust position;Breast compression;Position options;Expressed milk;Comfort gels;Shells;Hand pump  Lactation Tools Discussed/Used Tools: Pump Shell Type: Inverted Breast pump type: Manual   Consult Status Consult Status: Complete Date: 10/01/18 Follow-up type: Call as needed    Judee Clara 10/01/2018, 10:47 AM

## 2018-10-05 MED ORDER — LIDOCAINE HCL (PF) 1 % IJ SOLN
INTRAMUSCULAR | Status: DC | PRN
  Administered 2018-09-29: 5 mL via EPIDURAL

## 2018-10-05 MED ORDER — SODIUM CHLORIDE (PF) 0.9 % IJ SOLN
INTRAMUSCULAR | Status: DC | PRN
  Administered 2018-09-29: 12 mL/h via EPIDURAL

## 2018-10-05 NOTE — Anesthesia Procedure Notes (Signed)
Epidural Patient location during procedure: OB Start time: 09/29/2018 9:46 AM End time: 09/29/2018 9:59 AM  Staffing Anesthesiologist: ,  A, MD Performed: anesthesiologist   Preanesthetic Checklist Completed: patient identified, site marked, surgical consent, pre-op evaluation, timeout performed, IV checked, risks and benefits discussed and monitors and equipment checked  Epidural Patient position: sitting Prep: site prepped and draped and DuraPrep Patient monitoring: continuous pulse ox and blood pressure Approach: midline Location: L3-L4 Injection technique: LOR air  Needle:  Needle type: Tuohy  Needle gauge: 17 G Needle length: 9 cm and 9 Needle insertion depth: 5 cm cm Catheter type: closed end flexible Catheter size: 19 Gauge Catheter at skin depth: 10 cm Test dose: negative  Assessment Events: blood not aspirated, injection not painful, no injection resistance, negative IV test and no paresthesia  Additional Notes Patient identified. Risks/Benefits/Options discussed with patient including but not limited to bleeding, infection, nerve damage, paralysis, failed block, incomplete pain control, headache, blood pressure changes, nausea, vomiting, reactions to medication both or allergic, itching and postpartum back pain. Confirmed with bedside nurse the patient's most recent platelet count. Confirmed with patient that they are not currently taking any anticoagulation, have any bleeding history or any family history of bleeding disorders. Patient expressed understanding and wished to proceed. All questions were answered. Sterile technique was used throughout the entire procedure. Please see nursing notes for vital signs. Test dose was given through epidural needle and negative prior to continuing to dose epidural or start infusion. Warning signs of high block given to the patient including shortness of breath, tingling/numbness in hands, complete motor block, or any  concerning symptoms with instructions to call for help. Patient was given instructions on fall risk and not to get out of bed. All questions and concerns addressed with instructions to call with any issues. 1 Attempt (S) . Patient tolerated procedure well.     

## 2018-10-05 NOTE — Anesthesia Preprocedure Evaluation (Signed)
Anesthesia Evaluation  Patient identified by MRN, date of birth, ID band Patient awake    Reviewed: Allergy & Precautions, NPO status , Patient's Chart, lab work & pertinent test results  History of Anesthesia Complications (+) PONV  Airway Mallampati: II  TM Distance: >3 FB Neck ROM: Full    Dental no notable dental hx. (+) Teeth Intact   Pulmonary neg pulmonary ROS,    Pulmonary exam normal breath sounds clear to auscultation       Cardiovascular Exercise Tolerance: Good Normal cardiovascular exam Rhythm:Regular Rate:Normal     Neuro/Psych  Headaches,    GI/Hepatic negative GI ROS, Neg liver ROS,   Endo/Other    Renal/GU      Musculoskeletal   Abdominal   Peds  Hematology Hgb 11.9 Plt 307   Anesthesia Other Findings   Reproductive/Obstetrics (+) Pregnancy                             Anesthesia Physical Anesthesia Plan  ASA: II  Anesthesia Plan: Epidural   Post-op Pain Management:    Induction:   PONV Risk Score and Plan:   Airway Management Planned:   Additional Equipment:   Intra-op Plan:   Post-operative Plan:   Informed Consent: I have reviewed the patients History and Physical, chart, labs and discussed the procedure including the risks, benefits and alternatives for the proposed anesthesia with the patient or authorized representative who has indicated his/her understanding and acceptance.       Plan Discussed with:   Anesthesia Plan Comments: (For LEA )        Anesthesia Quick Evaluation
